# Patient Record
Sex: Male | Born: 1965
Health system: Southern US, Community
[De-identification: ages and names within clinical notes are randomized; demographics above are authoritative.]

## PROBLEM LIST (undated history)

## (undated) DIAGNOSIS — G4730 Sleep apnea, unspecified: Secondary | ICD-10-CM

## (undated) DIAGNOSIS — C4490 Unspecified malignant neoplasm of skin, unspecified: Secondary | ICD-10-CM

## (undated) DIAGNOSIS — J40 Bronchitis, not specified as acute or chronic: Secondary | ICD-10-CM

## (undated) DIAGNOSIS — K921 Melena: Secondary | ICD-10-CM

## (undated) DIAGNOSIS — T8859XA Other complications of anesthesia, initial encounter: Secondary | ICD-10-CM

## (undated) DIAGNOSIS — Z87442 Personal history of urinary calculi: Secondary | ICD-10-CM

## (undated) DIAGNOSIS — D509 Iron deficiency anemia, unspecified: Secondary | ICD-10-CM

## (undated) DIAGNOSIS — K635 Polyp of colon: Secondary | ICD-10-CM

## (undated) DIAGNOSIS — T4145XA Adverse effect of unspecified anesthetic, initial encounter: Secondary | ICD-10-CM

## (undated) DIAGNOSIS — R531 Weakness: Secondary | ICD-10-CM

## (undated) DIAGNOSIS — G4733 Obstructive sleep apnea (adult) (pediatric): Secondary | ICD-10-CM

## (undated) DIAGNOSIS — E785 Hyperlipidemia, unspecified: Secondary | ICD-10-CM

## (undated) DIAGNOSIS — K649 Unspecified hemorrhoids: Secondary | ICD-10-CM

## (undated) DIAGNOSIS — M1990 Unspecified osteoarthritis, unspecified site: Secondary | ICD-10-CM

## (undated) HISTORY — DX: Hyperlipidemia, unspecified: E78.5

## (undated) HISTORY — PX: COLONOSCOPY: SHX174

## (undated) HISTORY — DX: Sleep apnea, unspecified: G47.30

## (undated) HISTORY — PX: ESOPHAGOGASTRODUODENOSCOPY ENDOSCOPY: SHX5814

## (undated) HISTORY — PX: EYE SURGERY: SHX253

## (undated) HISTORY — PX: NOSE SURGERY: SHX723

## (undated) HISTORY — PX: PARTIAL KNEE ARTHROPLASTY: SHX2174

## (undated) HISTORY — DX: Unspecified hemorrhoids: K64.9

## (undated) HISTORY — DX: Iron deficiency anemia, unspecified: D50.9

## (undated) HISTORY — DX: Melena: K92.1

## (undated) HISTORY — DX: Weakness: R53.1

---

## 2004-01-09 HISTORY — PX: KNEE SURGERY: SHX244

## 2006-01-08 HISTORY — PX: BACK SURGERY: SHX140

## 2010-08-01 ENCOUNTER — Other Ambulatory Visit: Payer: Self-pay | Admitting: Sports Medicine

## 2010-08-01 ENCOUNTER — Ambulatory Visit
Admission: RE | Admit: 2010-08-01 | Discharge: 2010-08-01 | Disposition: A | Discharge status: Home / Self Care | Payer: Managed Care, Other (non HMO) | Source: Ambulatory Visit | Attending: Sports Medicine | Admitting: Sports Medicine

## 2010-08-01 DIAGNOSIS — T1490XA Injury, unspecified, initial encounter: Secondary | ICD-10-CM

## 2010-08-01 DIAGNOSIS — T148XXA Other injury of unspecified body region, initial encounter: Secondary | ICD-10-CM

## 2010-08-07 ENCOUNTER — Other Ambulatory Visit: Payer: Self-pay | Admitting: Internal Medicine

## 2010-08-08 ENCOUNTER — Encounter: Payer: Self-pay | Admitting: Internal Medicine

## 2010-08-22 ENCOUNTER — Encounter: Payer: Self-pay | Admitting: Internal Medicine

## 2010-08-22 ENCOUNTER — Other Ambulatory Visit: Payer: Managed Care, Other (non HMO) | Admitting: Internal Medicine

## 2010-08-22 DIAGNOSIS — Z0000 Encounter for general adult medical examination without abnormal findings: Secondary | ICD-10-CM

## 2010-08-22 LAB — CBC WITH DIFFERENTIAL/PLATELET
Basophils Relative: 1 % (ref 0–1)
Eosinophils Absolute: 0.1 10*3/uL (ref 0.0–0.7)
Eosinophils Relative: 2 % (ref 0–5)
HCT: 39.4 % (ref 39.0–52.0)
Hemoglobin: 11.8 g/dL — ABNORMAL LOW (ref 13.0–17.0)
MCH: 22.6 pg — ABNORMAL LOW (ref 26.0–34.0)
MCHC: 29.9 g/dL — ABNORMAL LOW (ref 30.0–36.0)
MCV: 75.3 fL — ABNORMAL LOW (ref 78.0–100.0)
Monocytes Absolute: 0.5 10*3/uL (ref 0.1–1.0)
Monocytes Relative: 8 % (ref 3–12)
Neutro Abs: 3.1 10*3/uL (ref 1.7–7.7)
RDW: 16.9 % — ABNORMAL HIGH (ref 11.5–15.5)

## 2010-08-22 LAB — COMPREHENSIVE METABOLIC PANEL
Albumin: 4 g/dL (ref 3.5–5.2)
Alkaline Phosphatase: 46 U/L (ref 39–117)
BUN: 16 mg/dL (ref 6–23)
Creat: 1.1 mg/dL (ref 0.50–1.35)
Glucose, Bld: 84 mg/dL (ref 70–99)
Potassium: 4.5 mEq/L (ref 3.5–5.3)

## 2010-08-22 LAB — LIPID PANEL
HDL: 69 mg/dL (ref >39)
LDL Cholesterol: 154 mg/dL — ABNORMAL HIGH (ref 0–99)
Total CHOL/HDL Ratio: 3.4 Ratio
Triglycerides: 75 mg/dL (ref <150)

## 2010-08-24 ENCOUNTER — Ambulatory Visit (INDEPENDENT_AMBULATORY_CARE_PROVIDER_SITE_OTHER): Payer: Managed Care, Other (non HMO) | Admitting: Internal Medicine

## 2010-08-24 ENCOUNTER — Encounter: Payer: Self-pay | Admitting: Internal Medicine

## 2010-08-24 ENCOUNTER — Other Ambulatory Visit: Payer: Self-pay | Admitting: Internal Medicine

## 2010-08-24 VITALS — BP 122/72 | HR 90 | Temp 97.9°F | Ht 68.0 in | Wt 182.0 lb

## 2010-08-24 DIAGNOSIS — D509 Iron deficiency anemia, unspecified: Secondary | ICD-10-CM

## 2010-08-24 DIAGNOSIS — E785 Hyperlipidemia, unspecified: Secondary | ICD-10-CM

## 2010-08-24 DIAGNOSIS — K625 Hemorrhage of anus and rectum: Secondary | ICD-10-CM

## 2010-08-24 DIAGNOSIS — Z0000 Encounter for general adult medical examination without abnormal findings: Secondary | ICD-10-CM

## 2010-08-24 LAB — POCT URINALYSIS DIPSTICK
Bilirubin, UA: NEGATIVE
Blood, UA: NEGATIVE
Ketones, UA: NEGATIVE
Nitrite, UA: NEGATIVE
Protein, UA: NEGATIVE
Urobilinogen, UA: NEGATIVE
pH, UA: 6.5

## 2010-08-24 LAB — IRON AND TIBC
%SAT: 6 % — ABNORMAL LOW (ref 20–55)
TIBC: 458 ug/dL — ABNORMAL HIGH (ref 215–435)

## 2010-09-05 ENCOUNTER — Encounter: Payer: Self-pay | Admitting: Internal Medicine

## 2010-09-05 DIAGNOSIS — K625 Hemorrhage of anus and rectum: Secondary | ICD-10-CM | POA: Insufficient documentation

## 2010-09-05 DIAGNOSIS — E785 Hyperlipidemia, unspecified: Secondary | ICD-10-CM | POA: Insufficient documentation

## 2010-09-05 NOTE — Progress Notes (Signed)
  Subjective:    Patient ID: Drew Ochoa, male    DOB: 03-18-1965, 45 y.o.   MRN: [id number]  HPI     First  visit for this 45 year old white male in for health maintenance. Patient had fasting lab studies done recently showing he is anemic. Patient complains of rectal bleeding off and on for 5 years. Thought he might have hemorrhoids. Has never had this evaluated. Was not aware he was anemic. Patient has no history of serious illnesses. No abdominal pain. No diarrhea. No blood in stool.    Review of Systems  Constitutional: Negative.   HENT: Negative.   Eyes: Negative.   Respiratory: Negative.   Cardiovascular: Negative.   Gastrointestinal: Positive for anal bleeding. Negative for nausea, abdominal pain, diarrhea, constipation, abdominal distention and rectal pain.  Genitourinary: Negative.   Musculoskeletal: Negative.   Neurological: Negative.   Hematological: Negative.   Psychiatric/Behavioral: Negative.        Objective:   Physical Exam  Vitals reviewed. Constitutional: He is oriented to person, place, and time. He appears well-developed and well-nourished.  HENT:  Head: Normocephalic and atraumatic.  Right Ear: External ear normal.  Left Ear: External ear normal.  Mouth/Throat: Oropharynx is clear and moist.  Eyes: Conjunctivae are normal. Pupils are equal, round, and reactive to light.  Neck: Neck supple. No JVD present. No thyromegaly present.  Cardiovascular: Normal rate, regular rhythm, normal heart sounds and intact distal pulses.   No murmur heard. Pulmonary/Chest: Effort normal and breath sounds normal. No respiratory distress. He has no wheezes. He has no rales.  Abdominal: Soft. Bowel sounds are normal. He exhibits no distension and no mass. There is no tenderness. There is no rebound and no guarding.  Genitourinary: Guaiac positive stool.       Anoscopy shows? Fungating area at 11:00  Musculoskeletal: Normal range of motion. He exhibits no edema and no  tenderness.  Lymphadenopathy:    He has no cervical adenopathy.  Neurological: He is alert and oriented to person, place, and time. He has normal reflexes. He displays normal reflexes. No cranial nerve deficit.  Skin: Skin is warm and dry. No rash noted.  Psychiatric: He has a normal mood and affect. His behavior is normal. Judgment and thought content normal.          Assessment & Plan:  Iron deficiency anemia  Rectal bleeding with? Mass in the rectum versus colitis  Hyperlipidemia-recommend trial of diet and exercise with recheck lipid panel in 6 months  Plan: GI evaluation as soon as possible

## 2010-09-06 ENCOUNTER — Telehealth: Payer: Self-pay

## 2010-09-06 NOTE — Telephone Encounter (Signed)
Patient has an appointment scheduled for 09/20/2010 at 1:30 with Dr. Rhea Belton at [facility], and he is aware of this

## 2010-09-20 ENCOUNTER — Encounter: Payer: Self-pay | Admitting: Internal Medicine

## 2010-09-20 ENCOUNTER — Ambulatory Visit (INDEPENDENT_AMBULATORY_CARE_PROVIDER_SITE_OTHER): Payer: Managed Care, Other (non HMO) | Admitting: Internal Medicine

## 2010-09-20 DIAGNOSIS — K625 Hemorrhage of anus and rectum: Secondary | ICD-10-CM

## 2010-09-20 DIAGNOSIS — D509 Iron deficiency anemia, unspecified: Secondary | ICD-10-CM

## 2010-09-20 MED ORDER — PEG-KCL-NACL-NASULF-NA ASC-C 100 G PO SOLR: 1.0000 | ORAL | Status: DC

## 2010-09-20 NOTE — Progress Notes (Signed)
Subjective:    Patient ID: Drew Ochoa, male    DOB: [birth year], 45 y.o.   MRN: [id number]  HPI Drew Ochoa is a 45 year old male with a past medical history of hyperlipidemia and a recent diagnosis of iron deficiency anemia he was seen in consultation at the request of Dr. Lenord Fellers for evaluation of rectal bleeding and iron deficiency. The patient reports that intermittently over the past one to 2 years he has been experiencing bright red blood per rectum. He says that this is present on the stool and on toilet tissue. It has been painless. He's had no other GI complaints. He reports that his stools are regular, formed, and otherwise brown.  He is noted no change in his stool caliber. He denies diarrhea or constipation. He said no abdominal pain. No nausea or vomiting. No heartburn. No dysphagia or odynophagia. No melena. His appetite remains good and his weight stable. He also reports no other obvious bleeding and no easy bruising.  Review of Systems Constitutional: Negative for fever, chills, night sweats, activity change, appetite change and unexpected weight change HEENT: Negative for sore throat, mouth sores and trouble swallowing. Eyes: Negative for visual disturbance Respiratory: Negative for cough, chest tightness and shortness of breath Cardiovascular: Negative for chest pain, palpitations and lower extremity swelling Gastrointestinal: See history of present illness Genitourinary: Negative for dysuria and hematuria. Musculoskeletal: Negative for back pain, arthralgias and myalgias Skin: Negative for rash or color change Neurological: Negative for headaches, weakness, numbness Hematological: Negative for adenopathy, negative for easy bruising/bleeding Psychiatric/behavioral: Negative for depressed mood, negative for anxiety  Past Medical History  Diagnosis Date  . Iron deficiency anemia   . Hyperlipidemia    Current Outpatient Prescriptions  Medication Sig Dispense Refill  .  FERROUS SULFATE PO Take 1 tablet by mouth 2 (two) times daily.        Marland Kitchen ibuprofen (ADVIL,MOTRIN) 200 MG tablet Take 200 mg by mouth every 6 (six) hours as needed.        . Naproxen Sodium (ALEVE) 220 MG CAPS Take 2 capsules by mouth as needed.        . peg 3350 powder (MOVIPREP) 100 G SOLR Take 1 kit (100 g total) by mouth as directed. See written handout  1 kit  0   No Known Allergies  Family History  Problem Relation Age of Onset  . Mental illness Mother   . Arthritis Father   . Diabetes Maternal Uncle   . Alzheimer's disease Maternal Uncle   . Depression Father   . Colon polyps Father   . Skin cancer Mother   --pt believes his father colon polyps were detected in his 79's   Social History  . Marital Status: Married    Number of Children: 0   Occupational History  . customs/import compliance - For [employer]    Social History Main Topics  . Smoking status: Current Some Day Smoker    Types: Cigars  . Smokeless tobacco: Never Used  . Alcohol Use: Yes     1 per day  . Drug Use: No      Objective:   Physical Exam BP 110/80  Pulse 76  Ht 5\' 8"  (1.727 m)  Wt 188 lb (85.276 kg)  BMI 28.59 kg/m2 Constitutional: Well-developed and well-nourished. No distress. HEENT: Normocephalic and atraumatic. Oropharynx is clear and moist. No oropharyngeal exudate. Conjunctivae are normal. Pupils are equal round and reactive to light. No scleral icterus. Neck: Neck supple. Trachea midline. Cardiovascular: Normal rate, regular  rhythm and intact distal pulses. No M/R/G Pulmonary/chest: Effort normal and breath sounds normal. No wheezing, rales or rhonchi. Abdominal: Soft, nontender, nondistended. Bowel sounds active throughout. There are no masses palpable. No hepatosplenomegaly. Lymphadenopathy: No cervical adenopathy noted. Neurological: Alert and oriented to person place and time. Skin: Skin is warm and dry. No rashes noted. Psychiatric: Normal mood and affect. Behavior is normal.  CBC     Component Value Date/Time   WBC 5.7 08/22/2010 0913   RBC 5.23 08/22/2010 0913   HGB 11.8* 08/22/2010 0913   HCT 39.4 08/22/2010 0913   PLT 313 08/22/2010 0913   MCV 75.3* 08/22/2010 0913   MCH 22.6* 08/22/2010 0913   MCHC 29.9* 08/22/2010 0913   RDW 16.9* 08/22/2010 0913   LYMPHSABS 1.9 08/22/2010 0913   MONOABS 0.5 08/22/2010 0913   EOSABS 0.1 08/22/2010 0913   BASOSABS 0.1 08/22/2010 0913   CMP     Component Value Date/Time   NA 140 08/22/2010 0913   K 4.5 08/22/2010 0913   CL 105 08/22/2010 0913   CO2 25 08/22/2010 0913   GLUCOSE 84 08/22/2010 0913   BUN 16 08/22/2010 0913   CREATININE 1.10 08/22/2010 0913   CALCIUM 9.1 08/22/2010 0913   PROT 6.8 08/22/2010 0913   ALBUMIN 4.0 08/22/2010 0913   AST 20 08/22/2010 0913   ALT 14 08/22/2010 0913   ALKPHOS 46 08/22/2010 0913   BILITOT 0.5 08/22/2010 0913      Assessment & Plan:  Is a 45 year old male with a past mental history of hyperlipidemia and a recent diagnosis of deficiency anemia. He also has painless rectal bleeding  1. Iron deficiency anemia/rectal bleeding -- given the patient's painless rectal bleeding and iron deficiency a colonoscopy is definitely indicated. We'll arrange this for him today. We discussed that if this is negative (i.e. No findings to explain iron deficiency), the next step would be upper endoscopy. He would prefer to start with a colonoscopy and perform the EGD at a later date if necessary. This is a reasonable start. I have advised he continue on his iron supplementation therapy.

## 2010-09-20 NOTE — Patient Instructions (Signed)
You have been scheduled for a Colonoscopy. Instructions have been provided. Your prep has been sent to your pharmacy.

## 2010-10-12 ENCOUNTER — Ambulatory Visit: Payer: Managed Care, Other (non HMO) | Admitting: Internal Medicine

## 2010-10-13 ENCOUNTER — Other Ambulatory Visit: Payer: Managed Care, Other (non HMO) | Admitting: Internal Medicine

## 2010-10-20 ENCOUNTER — Encounter: Payer: Self-pay | Admitting: Internal Medicine

## 2010-10-20 ENCOUNTER — Ambulatory Visit (INDEPENDENT_AMBULATORY_CARE_PROVIDER_SITE_OTHER): Payer: Managed Care, Other (non HMO) | Admitting: Internal Medicine

## 2010-10-20 VITALS — BP 119/76 | HR 78 | Temp 97.6°F

## 2010-10-20 DIAGNOSIS — K625 Hemorrhage of anus and rectum: Secondary | ICD-10-CM

## 2010-10-20 DIAGNOSIS — E611 Iron deficiency: Secondary | ICD-10-CM

## 2010-10-20 DIAGNOSIS — D649 Anemia, unspecified: Secondary | ICD-10-CM

## 2010-10-20 LAB — CBC WITH DIFFERENTIAL/PLATELET
Basophils Absolute: 0 10*3/uL (ref 0.0–0.1)
Basophils Relative: 0 % (ref 0–1)
HCT: 44.6 % (ref 39.0–52.0)
MCHC: 33.4 g/dL (ref 30.0–36.0)
Monocytes Absolute: 0.7 10*3/uL (ref 0.1–1.0)
Neutro Abs: 3.8 10*3/uL (ref 1.7–7.7)
Platelets: 292 10*3/uL (ref 150–400)
RDW: 20.9 % — ABNORMAL HIGH (ref 11.5–15.5)

## 2010-10-24 ENCOUNTER — Encounter: Payer: Self-pay | Admitting: *Deleted

## 2010-10-24 ENCOUNTER — Other Ambulatory Visit: Payer: Managed Care, Other (non HMO)

## 2010-10-24 ENCOUNTER — Encounter: Payer: Self-pay | Admitting: Internal Medicine

## 2010-10-24 ENCOUNTER — Ambulatory Visit (AMBULATORY_SURGERY_CENTER): Payer: Managed Care, Other (non HMO) | Admitting: Internal Medicine

## 2010-10-24 ENCOUNTER — Telehealth: Payer: Self-pay | Admitting: *Deleted

## 2010-10-24 VITALS — BP 105/68 | HR 71 | Temp 97.3°F | Resp 20 | Ht 68.0 in | Wt 188.0 lb

## 2010-10-24 DIAGNOSIS — D509 Iron deficiency anemia, unspecified: Secondary | ICD-10-CM

## 2010-10-24 DIAGNOSIS — K621 Rectal polyp: Secondary | ICD-10-CM

## 2010-10-24 DIAGNOSIS — K620 Anal polyp: Secondary | ICD-10-CM

## 2010-10-24 DIAGNOSIS — D126 Benign neoplasm of colon, unspecified: Secondary | ICD-10-CM

## 2010-10-24 DIAGNOSIS — K625 Hemorrhage of anus and rectum: Secondary | ICD-10-CM

## 2010-10-24 HISTORY — PX: COLONOSCOPY: SHX174

## 2010-10-24 MED ORDER — SODIUM CHLORIDE 0.9 % IV SOLN: 500.0000 mL | INTRAVENOUS | Status: DC

## 2010-10-24 NOTE — Patient Instructions (Addendum)
Follow your instructions on the [facility] and [employer] [facility]  Upper Endoscopy scheduled for October 30, 2010 at 9:00. Arrive at 8:00, Patient must have a caregiver with him and remain in waiting room until procedure completed.  Continue your medications.  Await pathology results.

## 2010-10-24 NOTE — Progress Notes (Signed)
Upper Endoscopy scheduled with wife at patient's convenience. Scheduled for October 30, 2010 at 0900 with arrival at 0800. Previsit done with Wife by Durwin Glaze RN. Patient taken to basement lab for Celiac Panel on discharge.

## 2010-10-24 NOTE — Telephone Encounter (Signed)
Order celiac panel per Dr Rhea Belton for ironn deficiency anemia.

## 2010-10-25 ENCOUNTER — Telehealth: Payer: Self-pay | Admitting: *Deleted

## 2010-10-25 LAB — CELIAC PANEL 10
Endomysial Screen: NEGATIVE
Tissue Transglutaminase Ab, IgA: 8.3 U/mL (ref <20)

## 2010-10-25 NOTE — Telephone Encounter (Signed)
No answer. Left message on voicemail

## 2010-10-27 ENCOUNTER — Telehealth: Payer: Self-pay | Admitting: *Deleted

## 2010-10-27 NOTE — Telephone Encounter (Signed)
lmom for pt to call back. Pt announced his name on the VM so I informed him of the negative Celiac Panel and Dr Rhea Belton will proceed with the EGD on 10/30/10 at 0900am; pt may call back with further questions.

## 2010-10-27 NOTE — Telephone Encounter (Signed)
Message copied by Florene Glen on Fri Oct 27, 2010  8:11 AM ------      Message from: Beverley Fiedler      Created: Wed Oct 25, 2010  2:59 PM       Celiac panel negative.  Proceed with EGD

## 2010-10-30 ENCOUNTER — Encounter: Payer: Self-pay | Admitting: Internal Medicine

## 2010-10-30 ENCOUNTER — Ambulatory Visit (AMBULATORY_SURGERY_CENTER): Payer: Managed Care, Other (non HMO) | Admitting: Internal Medicine

## 2010-10-30 DIAGNOSIS — D649 Anemia, unspecified: Secondary | ICD-10-CM

## 2010-10-30 DIAGNOSIS — K2940 Chronic atrophic gastritis without bleeding: Secondary | ICD-10-CM

## 2010-10-30 DIAGNOSIS — D509 Iron deficiency anemia, unspecified: Secondary | ICD-10-CM

## 2010-10-30 DIAGNOSIS — K625 Hemorrhage of anus and rectum: Secondary | ICD-10-CM

## 2010-10-30 MED ORDER — SODIUM CHLORIDE 0.9 % IV SOLN: 500.0000 mL | INTRAVENOUS | Status: DC

## 2010-10-30 NOTE — Patient Instructions (Signed)
MILD GASTRITIS  BIOPSIES TAKEN TO RULE OUT H. PYLORI AND CELIAC DISEASE     SEE GREEN AND BLUE SHEETS FOR ADDITIONAL D/C INSTRUCTIONS.

## 2010-10-31 ENCOUNTER — Encounter: Payer: Self-pay | Admitting: Internal Medicine

## 2010-10-31 ENCOUNTER — Telehealth: Payer: Self-pay

## 2010-10-31 NOTE — Telephone Encounter (Signed)
Left message on answering machine. 

## 2010-11-03 ENCOUNTER — Telehealth: Payer: Self-pay | Admitting: *Deleted

## 2010-11-03 ENCOUNTER — Encounter: Payer: Self-pay | Admitting: Internal Medicine

## 2010-11-03 DIAGNOSIS — E611 Iron deficiency: Secondary | ICD-10-CM

## 2010-11-03 NOTE — Telephone Encounter (Signed)
Message copied by Florene Glen on Fri Nov 03, 2010  4:38 PM ------      Message from: Beverley Fiedler      Created: Fri Nov 03, 2010  4:13 PM       Yes capsule endo for unexplain iron def.      thanks      ----- Message -----         From: Linna Hoff, RN         Sent: 11/03/2010   3:22 PM           To: Erick Blinks, MD            I erased the note for this pt before I printed the letter! Is the Capsule what the note is about? Thanks. cb

## 2010-11-03 NOTE — Telephone Encounter (Signed)
Notified pt Dr Rhea Belton would like for him to have a Capsule Endo r/t low iron levels. Pt was aware of the possibility. He will come Monday, 11/06/10 at 2pm for the teaching. I am putting his letter from Dr Rhea Belton in the mail today.

## 2010-11-04 NOTE — Patient Instructions (Signed)
Please proceed ahead with pan endoscopy. Return here in 9 months for physical examination. CBC with has been drawn today along with iron studies. Iron supplementation may or may not need to be continued depending on results of pan endoscopy

## 2010-11-04 NOTE — Progress Notes (Signed)
  Subjective:    Patient ID: Drew Ochoa, male    DOB: Jan 16, 1965, 45 y.o.   MRN: [id number]  HPI 45 year old white male who initially presented here in August as a new patient. Has had rectal bleeding off and on for 5 years and in that visit in August was found to have iron deficiency anemia. He has been referred to gastroenterologist and is due for colonoscopy soon. Has been taking iron sulfate 325 mg twice daily. Here today to followup on anemia. Feels well with no complaints.    Review of Systems     Objective:   Physical Exam skin is pale; cardiac exam regular rate and rhythm normal S1 and S2; chest clear        Assessment & Plan:  History of rectal bleeding-in the process of being evaluated and is due for panendoscopy later this week  Iron deficiency anemia  Plan CBC with differential and iron studies drawn. Notify patient of results. Await results of panendoscopy.

## 2010-11-06 NOTE — Telephone Encounter (Signed)
Pt here for instruction and teaching for Capsule Endoscopy per Dr Rhea Belton. Pt was shown leads that will connect to the computer unit for recording and the capsule he has to swallow. Pt given handouts about the module unit and purpose of the procedure as well as prep and diet instructions the day prior to the test. I will notify pt when his test is scheduled; Dr Rhea Belton has to sign the orders. Pt stated understanding.

## 2010-11-09 ENCOUNTER — Telehealth: Payer: Self-pay | Admitting: *Deleted

## 2010-11-09 DIAGNOSIS — D500 Iron deficiency anemia secondary to blood loss (chronic): Secondary | ICD-10-CM

## 2010-11-09 NOTE — Telephone Encounter (Signed)
Spoke with pt who is ok with his capsule appt.

## 2010-11-09 NOTE — Telephone Encounter (Signed)
lmom for pt to call back; scheduled Capsule Endo on 11/14/10 at 0800.

## 2010-11-14 ENCOUNTER — Ambulatory Visit (INDEPENDENT_AMBULATORY_CARE_PROVIDER_SITE_OTHER): Payer: Managed Care, Other (non HMO) | Admitting: Internal Medicine

## 2010-11-14 DIAGNOSIS — E611 Iron deficiency: Secondary | ICD-10-CM

## 2010-11-14 NOTE — Progress Notes (Signed)
Patient here for Capsule endoscopy for Dr. Rhea Belton. Patient verbalized understanding of verbal and written instructions. Patient has been NPO and did the prep required for the procedure. Patient swallowed the capsule without difficulty. Lot [id number]                                                                                                                                                     25

## 2010-12-06 ENCOUNTER — Telehealth: Payer: Self-pay | Admitting: *Deleted

## 2010-12-06 NOTE — Telephone Encounter (Signed)
lmom for pt to call  °

## 2010-12-06 NOTE — Telephone Encounter (Signed)
Notified pt of Dr Lauro Franklin  findings and recommendations. Pt stated understanding. Reminder to me in January to order labs.

## 2010-12-06 NOTE — Telephone Encounter (Signed)
Message copied by Florene Glen on Wed Dec 06, 2010  2:26 PM ------      Message from: Beverley Fiedler      Created: Wed Dec 06, 2010  1:18 PM      Regarding: VCE results       capsule endoscopy showed erythema in the duodenum, but this is likely of no consequence.  It is not felt to explain his iron deficiency.            Amy will print the report today            I recommend continuing on oral iron supplementation therapy and following blood counts to ensure normalization of iron stores      This can be done either through our office or his PCP Dr. Lenord Fellers

## 2011-02-05 ENCOUNTER — Telehealth: Payer: Self-pay | Admitting: *Deleted

## 2011-02-05 DIAGNOSIS — D509 Iron deficiency anemia, unspecified: Secondary | ICD-10-CM

## 2011-02-05 NOTE — Telephone Encounter (Signed)
Message copied by Florene Glen on Mon Feb 05, 2011  8:37 AM ------      Message from: Graciella Freer K      Created: Wed Dec 06, 2010  3:37 PM       Call to check iron levels. Ask Dr Rhea Belton what labs

## 2011-02-05 NOTE — Telephone Encounter (Signed)
Iron, tibc, ferritin, %sat, cbc with diff

## 2011-02-05 NOTE — Telephone Encounter (Signed)
lmom for pt to call back. Pt needs to come in for labs to see how well OTC iron is working.

## 2011-02-05 NOTE — Telephone Encounter (Signed)
Can you tell  what labs to order for pt? Pt had abnormal IRON, TIBC, UIBC,%SAT and was placed on oral iron. Thanks.

## 2011-02-06 ENCOUNTER — Telehealth: Payer: Self-pay | Admitting: Internal Medicine

## 2011-02-06 NOTE — Telephone Encounter (Signed)
Spoke with pt to inform him he needs to have labs drawn to see how well the po iron is working. Pt will come in at his convenience- he is out of town next week.

## 2011-02-06 NOTE — Telephone Encounter (Signed)
Called patient and left voicemail that he needed to call to schedule his 3 mo office visit with Dr. Lenord Fellers.  Patient is also due for CBC, IRON, and TIBC.

## 2011-02-23 ENCOUNTER — Encounter: Payer: Self-pay | Admitting: Internal Medicine

## 2011-02-23 ENCOUNTER — Ambulatory Visit (INDEPENDENT_AMBULATORY_CARE_PROVIDER_SITE_OTHER): Payer: Managed Care, Other (non HMO) | Admitting: Internal Medicine

## 2011-02-23 VITALS — BP 102/76 | HR 76 | Temp 97.6°F | Wt 190.0 lb

## 2011-02-23 DIAGNOSIS — Z23 Encounter for immunization: Secondary | ICD-10-CM

## 2011-02-23 DIAGNOSIS — K648 Other hemorrhoids: Secondary | ICD-10-CM

## 2011-02-23 DIAGNOSIS — D509 Iron deficiency anemia, unspecified: Secondary | ICD-10-CM

## 2011-02-23 LAB — IRON AND TIBC
%SAT: 18 % — ABNORMAL LOW (ref 20–55)
TIBC: 317 ug/dL (ref 215–435)

## 2011-02-24 LAB — CBC WITH DIFFERENTIAL/PLATELET
Basophils Absolute: 0.1 10*3/uL (ref 0.0–0.1)
Eosinophils Relative: 2 % (ref 0–5)
HCT: 44.7 % (ref 39.0–52.0)
Hemoglobin: 14.6 g/dL (ref 13.0–17.0)
Lymphocytes Relative: 29 % (ref 12–46)
Lymphs Abs: 2 10*3/uL (ref 0.7–4.0)
MCV: 90.5 fL (ref 78.0–100.0)
Monocytes Absolute: 0.7 10*3/uL (ref 0.1–1.0)
Monocytes Relative: 10 % (ref 3–12)
RDW: 14.6 % (ref 11.5–15.5)
WBC: 6.8 10*3/uL (ref 4.0–10.5)

## 2011-02-25 ENCOUNTER — Encounter: Payer: Self-pay | Admitting: Internal Medicine

## 2011-02-25 DIAGNOSIS — K648 Other hemorrhoids: Secondary | ICD-10-CM | POA: Insufficient documentation

## 2011-02-25 NOTE — Progress Notes (Signed)
  Subjective:    Patient ID: Drew Ochoa, male    DOB: 1965-08-23, 46 y.o.   MRN: [id number]  HPI Patient initially presented here as a new patient August 2012. He was found to be iron deficient with serum iron 28 with an iron-binding capacity of 458. Percent saturation was low at 6%. Hemoglobin was 11.8 g with an MCV of 75.3. Patient reported having had rectal bleeding for 5 years. He was sent to gastroenterologist, Dr. Rhea Belton who performed both endoscopy and colonoscopy. Patient was found to have large internal hemorrhoids and mild gastritis. He was placed on oral iron replacement and continues to take that. He says that in November he was on a trip and had significant rectal bleeding once again. In January he had another episode of rectal bleeding- both of these episodes  described as being a lot a bleeding. CBC with differential and serum iron studies drawn today. He has appointment to see gastroenterologist next week. Otherwise feels well with no complaints. Had influenza immunization at work. Was given Tdap vaccine in the office today.    Review of Systems     Objective:   Physical Exam skin is pale warm and dry; chest clear; cardiac exam regular rate and rhythm        Assessment & Plan:  History of iron deficiency anemia presumably for rectal bleeding from internal hemorrhoids  History of mild gastritis diagnosed on endoscopy  History of hyperlipidemia  Plan: Patient is to see gastroenterologist next week. CBC, iron, iron-binding capacity drawn today. He continues to have issues with rectal bleeding. May be a candidate for hemorrhoidectomy. Asked him to discuss with gastroenterologist. Return for annual physical exam August 2013. We'll review lab studies and advise further.

## 2011-02-25 NOTE — Patient Instructions (Signed)
We have drawn a complete blood count, serum iron and iron-binding capacity today. Results will be available to Dr. Rhea Belton. Please keep appointment to see him next week. Please discuss with Dr. Rhea Belton about possible hemorrhoidectomy for persistent rectal bleeding.

## 2011-02-28 ENCOUNTER — Telehealth: Payer: Self-pay | Admitting: *Deleted

## 2011-02-28 NOTE — Telephone Encounter (Signed)
lmom for pt to call. We are questioning whether he needs an appt for rectal bleeding f/u.

## 2011-02-28 NOTE — Telephone Encounter (Signed)
I reviewed this from Dr. Lenord Fellers, I'm not sure if he was supposed to see me or not because I do not see an appointment for him, but office followup is reasonable. Aram Beecham could you facilitate this please

## 2011-02-28 NOTE — Telephone Encounter (Signed)
Spoke with pt about Dr Beryle Quant result note and recent bleeding episodes. Pt reports he hasn't had any bleeding since the 1st of the year. He thinks the hemorrhoids came from traveling too much. Pt scheduled for f/u on 03/06/11.

## 2011-03-05 ENCOUNTER — Encounter: Payer: Self-pay | Admitting: Internal Medicine

## 2011-03-06 ENCOUNTER — Encounter: Payer: Self-pay | Admitting: Internal Medicine

## 2011-03-06 ENCOUNTER — Ambulatory Visit (INDEPENDENT_AMBULATORY_CARE_PROVIDER_SITE_OTHER): Payer: Managed Care, Other (non HMO) | Admitting: Internal Medicine

## 2011-03-06 VITALS — BP 116/86 | HR 76 | Ht 68.0 in | Wt 190.0 lb

## 2011-03-06 DIAGNOSIS — D649 Anemia, unspecified: Secondary | ICD-10-CM

## 2011-03-06 DIAGNOSIS — K648 Other hemorrhoids: Secondary | ICD-10-CM

## 2011-03-06 DIAGNOSIS — E611 Iron deficiency: Secondary | ICD-10-CM

## 2011-03-06 NOTE — Patient Instructions (Signed)
Please take your iron pills  Once a day until May 23, 2011 have labs drawn. DO NOT GO BACK on your iron pills until notified by Dr. Rhea Belton.  Have labs re-drawn in mid August.   Follow up with Dr. Rhea Belton as needed.

## 2011-03-06 NOTE — Progress Notes (Signed)
Subjective:    Patient ID: Ilai Hiller, male    DOB: [birth year], 46 y.o.   MRN: [id number]  HPI Mr. Tugwell is a 46 year old male who I've previously seen in evaluation for iron deficiency anemia who seen for followup. He is alone today. He underwent upper endoscopy, colonoscopy, and subsequent video capsule endoscopy without revealing a cause for his iron deficiency. He did have mild gastritis in the stomach which was H. pylori negative. He was found to have moderate internal hemorrhoids and 2 small hyperplastic rectal polyps.  His video capsule endoscopy was unremarkable. He was thus treated with oral iron supplementation, and labs recently revealed improvement in his iron deficiency and normalization of his red cell count and MCV.  He reports no troublesome symptoms at present. He is feeling well. No rectal bleeding or trouble with internal hemorrhoids of late. He is taking iron orally on most days, when he remembers. He was initially taking it twice daily, but he was told to decrease this to once daily. He denies diarrhea and constipation. No abdominal pain. No nausea or vomiting. He's exercising more now but has gained some weight. No heartburn trouble. No dysphagia.  Review of Systems As per history of present illness otherwise negative  Past Medical History  Diagnosis Date  . Iron deficiency anemia   . Hyperlipidemia    Current Outpatient Prescriptions  Medication Sig Dispense Refill  . FERROUS SULFATE PO Take 1 tablet by mouth daily.       Marland Kitchen ibuprofen (ADVIL,MOTRIN) 200 MG tablet Take 200 mg by mouth every 6 (six) hours as needed.        . Naproxen Sodium (ALEVE) 220 MG CAPS Take 2 capsules by mouth as needed.         No Known Allergies  Family History  Problem Relation Age of Onset  . Mental illness Mother   . Arthritis Father   . Diabetes Maternal Uncle   . Alzheimer's disease Maternal Uncle   . Depression Father   . Colon polyps Father   . Skin cancer Mother     Social History  . Marital Status: Married   Occupational History  . customs/import compliance    Social History Main Topics  . Smoking status: Current Some Day Smoker    Types: Cigars  . Smokeless tobacco: Never Used  . Alcohol Use: 1.8 oz/week    3 Glasses of wine per week     1 per day  . Drug Use: No      Objective:   Physical Exam BP 116/86  Pulse 76  Ht 5\' 8"  (1.727 m)  Wt 190 lb (86.183 kg)  BMI 28.89 kg/m2 Constitutional: Well-developed and well-nourished. No distress. HEENT: Normocephalic and atraumatic. Oropharynx is clear and moist. No oropharyngeal exudate. Conjunctivae are normal. Pupils are equal round and reactive to light. No scleral icterus. Neck: Neck supple. Trachea midline. Cardiovascular: Normal rate, regular rhythm and intact distal pulses. No M/R/G Pulmonary/chest: Effort normal and breath sounds normal. No wheezing, rales or rhonchi. Abdominal: Soft, nontender, nondistended. Bowel sounds active throughout. There are no masses palpable. No hepatosplenomegaly. Extremities: no clubbing, cyanosis, or edema Lymphadenopathy: No cervical adenopathy noted. Neurological: Alert and oriented to person place and time. Skin: Skin is warm and dry. No rashes noted. Psychiatric: Normal mood and affect. Behavior is normal.  CBC    Component Value Date/Time   WBC 6.8 02/23/2011 1613   RBC 4.94 02/23/2011 1613   HGB 14.6 02/23/2011 1613   HCT 44.7 02/23/2011  1613   PLT 264 02/23/2011 1613   MCV 90.5 02/23/2011 1613   MCH 29.6 02/23/2011 1613   MCHC 32.7 02/23/2011 1613   RDW 14.6 02/23/2011 1613   LYMPHSABS 2.0 02/23/2011 1613   MONOABS 0.7 02/23/2011 1613   EOSABS 0.1 02/23/2011 1613   BASOSABS 0.1 02/23/2011 1613   Iron/TIBC/Ferritin    Component Value Date/Time   IRON 57 02/23/2011 1613   TIBC 317 02/23/2011 1613       Assessment & Plan:  46 year old male who I've previously seen in evaluation for iron deficiency anemia who seen for followup.  1. Hx of iron  def anemia -- I do not have an etiology to explain his iron def anemia, but it has responded to oral iron replacement.  He is not having any overt bleeding at present, and his internal hemorrhoids are not bothering him now.  Certainly he could have had occult blood loss, but there is no evidence to support this is continuing.  He was celiac negative.  His iron sat is still just a bit low, so for now, I have recommended he continue with once daily po iron for 3 months, then repeat CBC + iron stores.  If normal at that point, I have advised he stop PO iron supplements and then monitor H/H from there.  We will plan CBC + iron stores in Aug 2013 (3 months off iron).  Should anemia or iron def return, we will consider further evaluation at that time.  He remains symptom free.  He is agreeable with this plan.  2. Internal hemorrhoids -- not currently a problem, but if they become troublesome, bleeding, pain, etc, then we can Rx topical therapy.  [facility] referral to be considered if not responding to medical therapy.  He will call if they flare or bother him.  Return PRN but I will follow lab results.

## 2011-06-11 ENCOUNTER — Ambulatory Visit (INDEPENDENT_AMBULATORY_CARE_PROVIDER_SITE_OTHER): Payer: Managed Care, Other (non HMO) | Admitting: Internal Medicine

## 2011-06-11 VITALS — BP 110/84 | HR 76 | Temp 98.4°F | Ht 68.0 in | Wt 188.0 lb

## 2011-06-11 DIAGNOSIS — R0789 Other chest pain: Secondary | ICD-10-CM

## 2011-06-11 DIAGNOSIS — J069 Acute upper respiratory infection, unspecified: Secondary | ICD-10-CM

## 2011-06-11 DIAGNOSIS — R071 Chest pain on breathing: Secondary | ICD-10-CM

## 2011-06-24 ENCOUNTER — Encounter: Payer: Self-pay | Admitting: Internal Medicine

## 2011-06-24 NOTE — Patient Instructions (Addendum)
Take Augmentin 500 mg 3 times daily for 10 days. Take Tessalon Perles as needed for cough. Call if not better in one week.

## 2011-06-24 NOTE — Progress Notes (Signed)
  Subjective:    Patient ID: Drew Ochoa, male    DOB: February 01, 1965, 46 y.o.   MRN: [id number]  HPI 46 year old white male with history of iron deficiency anemia presumably secondary to hemorrhoids and rectal bleeding has been worked up by gastroenterologist. Aside from hemorrhoids negative colonoscopy. He is in today with URI symptoms and chest pain. Has been having respiratory infection symptoms for several days. Doesn't seem to be getting better. Has had a lot of coughing. Some discolored sputum. No fever or shaking chills. Malaise and fatigue.    Review of Systems     Objective:   Physical Exam HEENT exam: TMs are clear, pharynx is clear, neck is supple without significant adenopathy, chest is clear to auscultation without rales or wheezing. He has palpable chest wall pain and left lateral chest.        Assessment & Plan:  URI  Chest wall pain  Plan: Augmentin 500 mg 3 times daily for 10 days. Tessalon Perles 100 mg (#60) 2 by mouth 3 times a day when necessary cough. May take anti-inflammatory medication for chest wall pain.

## 2011-08-23 ENCOUNTER — Other Ambulatory Visit: Payer: Managed Care, Other (non HMO) | Admitting: Internal Medicine

## 2011-08-23 DIAGNOSIS — Z0000 Encounter for general adult medical examination without abnormal findings: Secondary | ICD-10-CM

## 2011-08-23 LAB — CBC WITH DIFFERENTIAL/PLATELET
Basophils Absolute: 0.1 10*3/uL (ref 0.0–0.1)
Eosinophils Absolute: 0.2 10*3/uL (ref 0.0–0.7)
Eosinophils Relative: 3 % (ref 0–5)
Lymphs Abs: 1.7 10*3/uL (ref 0.7–4.0)
MCH: 30.4 pg (ref 26.0–34.0)
MCV: 83.6 fL (ref 78.0–100.0)
Monocytes Absolute: 0.7 10*3/uL (ref 0.1–1.0)
Platelets: 268 10*3/uL (ref 150–400)
RDW: 13.7 % (ref 11.5–15.5)

## 2011-08-23 LAB — COMPREHENSIVE METABOLIC PANEL
ALT: 13 U/L (ref 0–53)
BUN: 12 mg/dL (ref 6–23)
CO2: 28 mEq/L (ref 19–32)
Creat: 1.18 mg/dL (ref 0.50–1.35)
Total Bilirubin: 0.9 mg/dL (ref 0.3–1.2)

## 2011-08-23 LAB — LIPID PANEL
Cholesterol: 213 mg/dL — ABNORMAL HIGH (ref 0–200)
HDL: 61 mg/dL (ref >39)
Total CHOL/HDL Ratio: 3.5 Ratio
VLDL: 13 mg/dL (ref 0–40)

## 2011-08-24 ENCOUNTER — Encounter: Payer: Self-pay | Admitting: Internal Medicine

## 2011-08-24 ENCOUNTER — Ambulatory Visit (INDEPENDENT_AMBULATORY_CARE_PROVIDER_SITE_OTHER): Payer: Managed Care, Other (non HMO) | Admitting: Internal Medicine

## 2011-08-24 VITALS — BP 114/84 | HR 80 | Temp 97.0°F | Ht 68.0 in | Wt 186.0 lb

## 2011-08-24 DIAGNOSIS — K648 Other hemorrhoids: Secondary | ICD-10-CM

## 2011-08-24 DIAGNOSIS — M25561 Pain in right knee: Secondary | ICD-10-CM

## 2011-08-24 DIAGNOSIS — M25569 Pain in unspecified knee: Secondary | ICD-10-CM

## 2011-08-24 DIAGNOSIS — Z862 Personal history of diseases of the blood and blood-forming organs and certain disorders involving the immune mechanism: Secondary | ICD-10-CM

## 2011-08-24 DIAGNOSIS — Z0000 Encounter for general adult medical examination without abnormal findings: Secondary | ICD-10-CM

## 2011-08-24 DIAGNOSIS — M2240 Chondromalacia patellae, unspecified knee: Secondary | ICD-10-CM

## 2011-08-24 LAB — POCT URINALYSIS DIPSTICK
Blood, UA: NEGATIVE
Ketones, UA: NEGATIVE
Protein, UA: NEGATIVE
Spec Grav, UA: 1.01
Urobilinogen, UA: NEGATIVE
pH, UA: 6

## 2011-08-26 DIAGNOSIS — M25561 Pain in right knee: Secondary | ICD-10-CM | POA: Insufficient documentation

## 2011-08-26 DIAGNOSIS — M25562 Pain in left knee: Secondary | ICD-10-CM | POA: Insufficient documentation

## 2011-08-26 NOTE — Patient Instructions (Addendum)
Please see Dr. Roanna Epley for bilateral knee pain. Return in one year or as needed. Watch diet and try to exercise. Watch amounts of anti-inflammatory medications that she take. These can aggravate rectal bleeding. They also can aggravate and cause gastritis.

## 2011-08-26 NOTE — Progress Notes (Signed)
Subjective:    Patient ID: Drew Ochoa, male    DOB: September 01, 1965, 46 y.o.   MRN: [id number]  HPI 46 year old white male with history of iron deficiency anemia thought secondary to significant rectal bleeding from hemorrhoids status post thorough GI workup and 2012. Patient was taking iron supplement but quit a few months ago. Now has normal hemoglobin. Rectal bleeding has decreased in frequency and seldom occurs now. First presented to the office late August 2012. History of hyperlipidemia started on diet exercise in 2012.   He had colonoscopy in October showing large internal hemorrhoids. He subsequently had upper endoscopy showing antral gastritis. He had 2 small sessile colon polyps and pathology showed them  to be hyperplastic polyps. Subsequently had a capsule endoscopy that proved to be negative.  Grandmother with history of massive stroke is 14 and died at age 49. Maternal grandfather had CABG at age 47 and died at age 23. Mother with history of depression and skin cancer. Father with history of rheumatoid arthritis diagnosed at age 43. History of colon polyps. Paternal grandfather with history of alcoholism but recovered at age 77. Had CABG at age 44 and died at age 43. Paternal grandmother living in good health.       When initially evaluated in August 2012 he had an iron level of 28 with TIBC of 458. Hemoglobin was 11.8 with an MCV of 75.3. Total cholesterol was 238 with an LDL cholesterol of 154, HDL cholesterol of 69 and triglycerides of 75.  Social history: He is married and works for [employer]. Wife travels extensively in [place] and has been gone for several months and recently returned. No children.  Occasionally smokes cigars. Social alcohol consumption.  Brings up a new problem today of bilateral knee pain. Review of Systems  Constitutional: Negative.   HENT: Negative.   Eyes: Negative.   Respiratory: Negative.   Cardiovascular: Negative.   Genitourinary: Negative.    Musculoskeletal:       Bilateral knee pain  Neurological: Negative.   Hematological: Negative.   Psychiatric/Behavioral: Negative.        Objective:   Physical Exam  Vitals reviewed. Constitutional: He is oriented to person, place, and time. He appears well-developed and well-nourished. No distress.  HENT:  Head: Normocephalic and atraumatic.  Right Ear: External ear normal.  Left Ear: External ear normal.  Mouth/Throat: Oropharynx is clear and moist.  Eyes: Conjunctivae and EOM are normal. Pupils are equal, round, and reactive to light. Right eye exhibits no discharge. Left eye exhibits no discharge. No scleral icterus.  Neck: Neck supple. No JVD present. No thyromegaly present.  Cardiovascular: Normal rate, regular rhythm and normal heart sounds.   No murmur heard. Pulmonary/Chest: Effort normal and breath sounds normal. He has no rales.  Abdominal: Soft. Bowel sounds are normal. He exhibits no distension and no mass. There is no tenderness. There is no rebound and no guarding.  Genitourinary: Prostate normal.       1 large external hemorrhoidal tag  Musculoskeletal: He exhibits tenderness. He exhibits no edema.       Tenderness over left patella. Crepitus left knee. Right knee some tenderness along medial collateral ligament. Bilateral swelling of ankles. History of multiple ankle sprains. Superficial varicosities around the ankle  Lymphadenopathy:    He has no cervical adenopathy.  Neurological: He is alert and oriented to person, place, and time. He has normal reflexes. No cranial nerve deficit. Coordination normal.  Skin: Skin is warm and dry. No rash noted. He  is not diaphoretic.  Psychiatric: He has a normal mood and affect. His behavior is normal. Judgment and thought content normal.          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  Moderate hyperlipidemia-improved  Plan: Return in one year or as needed. 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 Suggest patient see Dr. Roanna Epley regarding knee pain

## 2011-09-18 ENCOUNTER — Ambulatory Visit (INDEPENDENT_AMBULATORY_CARE_PROVIDER_SITE_OTHER): Payer: Managed Care, Other (non HMO) | Admitting: Sports Medicine

## 2011-09-18 ENCOUNTER — Encounter: Payer: Self-pay | Admitting: Sports Medicine

## 2011-09-18 VITALS — BP 116/80 | HR 88 | Ht 68.0 in | Wt 188.0 lb

## 2011-09-18 DIAGNOSIS — M25561 Pain in right knee: Secondary | ICD-10-CM

## 2011-09-18 DIAGNOSIS — M25569 Pain in unspecified knee: Secondary | ICD-10-CM

## 2011-09-18 NOTE — Progress Notes (Signed)
  [facility]  Patient name: Drew Ochoa MRN [id number]  Date of birth: Oct 01, 1965  CC & HPI:  Cleatus Gabriel is a 46 y.o. male presenting today for evaluation of B knee pain.  He reports that he has had ~1 year of nagging pain in his R > L knee located along the medial joint line.  He has crepitation, clicking.  Denies recent locking but does report a history of this Bilaterally.    Pain is worse in the morning after exerting himself the day before.  Some moderate pain following exercise.   Participates in Hiking, cycling, walking.  Pain @ with cycling.  Hiking/walking pain after to 1 hour.    Crepitations and clicking occur after sitting at work desk.    No falls.  ROS:  Per HPI  Pertinent History Reviewed:  Medical & Surgical Hx:  Reviewed: Significant for HLD, Iron deficiency anemia and prior gastritis Medications: Reviewed & Updated - see associated section Social History: Reviewed - Significant for non-smoker  Objective Findings:  Vitals:  Filed Vitals:   09/18/11 1522  BP: 116/80  Pulse: 88    PE: GENERAL:  Adult caucasian male. In no discomfort; no respiratory distress. KNEE:  R knee: medial joint line tenderness, negative McMurrary's, positive patellar grind,  A/P drawer negative; negative varus/valgus stress,  Left patellar subluxation with 90 degrees of flexion.  Poor tracking of R patella HIP: Abductors 4/5 strength Bilateral ANKLE:  Mildly + drawer on R, no pitting edema FOOT: pes cavus foot with mild flattening of R with weight bearing.  Good forefoot mobility, normal ROM, normal Strength in all planes   Assessment & Plan:

## 2011-09-18 NOTE — Assessment & Plan Note (Signed)
Mild B lateral patellar subluxation with deep knee flexion.   + crepitation VMO strengthening exercises provided + hip abduction exercises provided B patellar-helix straps provided B sports insoles with scaphoid pads provided; to be worn with all activity Instructed to be evaluated at local bicycle shop for appropriate positioning on bike  Plan to follow up in 4-6 weeks to reassess.  Consider trial of NSAIDs if not significantly improved.

## 2011-10-24 ENCOUNTER — Ambulatory Visit: Payer: Managed Care, Other (non HMO) | Admitting: Sports Medicine

## 2011-11-13 ENCOUNTER — Telehealth: Payer: Self-pay | Admitting: Internal Medicine

## 2011-11-13 NOTE — Telephone Encounter (Signed)
If he has [facility], send him to Ollen Gross.  If he has [employer], to [employer]. OK to send him back to Dr. Rhea Belton. Please call them for appt.

## 2011-11-13 NOTE — Telephone Encounter (Signed)
Sp w/Ellen Andrey Campanile,  PhD...she takes [place].  Sp w/patient, provided Dr. Tawana Scale # and address.  Dr. Rhea Belton ([facility]) sched for 11/12 @ 10:00.  Sp w/patient and made aware.  Pt verbalizes understanding.

## 2011-11-19 ENCOUNTER — Encounter: Payer: Self-pay | Admitting: Internal Medicine

## 2011-11-20 ENCOUNTER — Ambulatory Visit (INDEPENDENT_AMBULATORY_CARE_PROVIDER_SITE_OTHER): Payer: Managed Care, Other (non HMO) | Admitting: Internal Medicine

## 2011-11-20 ENCOUNTER — Encounter: Payer: Self-pay | Admitting: Sports Medicine

## 2011-11-20 ENCOUNTER — Ambulatory Visit (INDEPENDENT_AMBULATORY_CARE_PROVIDER_SITE_OTHER): Payer: Managed Care, Other (non HMO) | Admitting: Sports Medicine

## 2011-11-20 ENCOUNTER — Encounter: Payer: Self-pay | Admitting: Internal Medicine

## 2011-11-20 ENCOUNTER — Other Ambulatory Visit (INDEPENDENT_AMBULATORY_CARE_PROVIDER_SITE_OTHER): Payer: Managed Care, Other (non HMO)

## 2011-11-20 VITALS — BP 117/84 | HR 77 | Ht 68.0 in | Wt 190.0 lb

## 2011-11-20 VITALS — BP 106/84 | HR 64 | Ht 68.0 in | Wt 190.0 lb

## 2011-11-20 DIAGNOSIS — M25561 Pain in right knee: Secondary | ICD-10-CM

## 2011-11-20 DIAGNOSIS — K625 Hemorrhage of anus and rectum: Secondary | ICD-10-CM

## 2011-11-20 DIAGNOSIS — D649 Anemia, unspecified: Secondary | ICD-10-CM

## 2011-11-20 DIAGNOSIS — K648 Other hemorrhoids: Secondary | ICD-10-CM

## 2011-11-20 DIAGNOSIS — M25569 Pain in unspecified knee: Secondary | ICD-10-CM

## 2011-11-20 LAB — CBC WITH DIFFERENTIAL/PLATELET
Basophils Relative: 0.6 % (ref 0.0–3.0)
Eosinophils Relative: 1.7 % (ref 0.0–5.0)
HCT: 42.2 % (ref 39.0–52.0)
Hemoglobin: 14.2 g/dL (ref 13.0–17.0)
Lymphs Abs: 1.4 10*3/uL (ref 0.7–4.0)
MCV: 88 fl (ref 78.0–100.0)
Monocytes Absolute: 0.5 10*3/uL (ref 0.1–1.0)
Monocytes Relative: 10.6 % (ref 3.0–12.0)
Neutro Abs: 2.9 10*3/uL (ref 1.4–7.7)
Platelets: 290 10*3/uL (ref 150.0–400.0)
WBC: 5 10*3/uL (ref 4.5–10.5)

## 2011-11-20 LAB — IBC PANEL: Saturation Ratios: 13 % — ABNORMAL LOW (ref 20.0–50.0)

## 2011-11-20 MED ORDER — HYDROCORTISONE ACETATE 25 MG RE SUPP: 25.0000 mg | Freq: Two times a day (BID) | RECTAL | Status: DC

## 2011-11-20 NOTE — Progress Notes (Signed)
Patient ID: Drew Ochoa, male   DOB: Jun 03, 1965, 46 y.o.   MRN: [id number]  Follow up of bilat PFPS He had lateral tracking and hip weakness on last visit Insoles have helped a lot for hiking HEP has led to at least 50% decrease in pain No locking No giving way No swelling  PE NAD  Knee: Normal to inspection with no erythema or effusion or obvious bony abnormalities. Palpation normal with no warmth or joint line tenderness or patellar tenderness or condyle tenderness. ROM normal in flexion and extension and lower leg rotation. Ligaments with solid consistent endpoints including ACL, PCL, LCL, MCL. Negative Mcmurray's and provocative meniscal tests. Non painful patellar compression. Patellar and quadriceps tendons unremarkable. Hamstring and quadriceps strength is normal.  With repeat knee flexion he gets some lateral tracking superior patella left with click On Rt he has an area along lateral patella that clicks and has probably thickened plica  Hip strength is now normal bilat

## 2011-11-20 NOTE — Assessment & Plan Note (Signed)
This has improved a good deal  Still needs to work on building more VMO strength  HEP advanced today  If this improves a lot next 6 wks can RTC prn but keep up insoles and some of exercises

## 2011-11-20 NOTE — Progress Notes (Signed)
  Subjective:    Patient ID: Drew Ochoa, male    DOB: [birth year], 46 y.o.   MRN: [id number]  HPI Drew Ochoa is a 46 year old male known to me for evaluation of iron deficiency anemia. He underwent upper endoscopy, colonoscopy, and be a capsule endoscopy without revealing a cause for iron deficiency. He did have mild gastritis which was H. pylori negative. He was also found to have 2 rectal hyperplastic polyps and moderate internal hemorrhoids. He was treated with oral iron supplementation therapy and his iron deficiency resolved. He returns today to discuss bright red rectal bleeding. He reports in late October having significant rectal bleeding with each bowel movement. This lasted for approximately 8 days. It was associated with anal pain. He did not treat this with any over-the-counter preparation. He reports he was having brown stool, along with bright red rectal bleeding. At times it was significant enough to drip into the toilet water. He at times had leakage of bloody material. No frank leakage of stool. He denies diarrhea and constipation. Usually his stools are soft and even at times loose. No abdominal pain. Appetite is good. No nausea or vomiting. No fevers or chills. No change in weight.   Review of Systems As per history of present illness, otherwise negative  Current Medications, Allergies, Past Medical History, Past Surgical History, Family History and Social History were reviewed in [employer] record.     Objective:   Physical Exam BP 106/84  Pulse 64  Ht 5\' 8"  (1.727 m)  Wt 190 lb (86.183 kg)  BMI 28.89 kg/m2 Constitutional: Well-developed and well-nourished. No distress. HEENT: Normocephalic and atraumatic. Oropharynx is clear and moist. No oropharyngeal exudate. Conjunctivae are normal.  No scleral icterus. Cardiovascular: Normal rate, regular rhythm and intact distal pulses. No M/R/G Pulmonary/chest: Effort normal and breath sounds normal. No  wheezing, rales or rhonchi. Abdominal: Soft, nontender, nondistended. Bowel sounds active throughout. There are no masses palpable. No hepatosplenomegaly. Extremities: no clubbing, cyanosis, or edema Neurological: Alert and oriented to person place and time. Skin: Skin is warm and dry. No rashes noted. Psychiatric: Normal mood and affect. Behavior is normal.  Labs -- pending today (CBC, iron studies)      Assessment & Plan:  46 year old male known to me for evaluation of iron deficiency anemia. He underwent upper endoscopy, colonoscopy, and be a capsule endoscopy without revealing a cause for iron deficiency who returns for bright red rectal bleeding  1.  Internal hemorrhoids -- the patient's symptoms are very consistent with hemorrhoidal bleeding. He had moderate sized internal hemorrhoids as seen at the time of his colonoscopy in October 2012.  Given his bleeding I will check a CBC today along with iron stores. I also will give him prescription for hydrocortisone suppositories 25 mg twice a day. He can use these for 5-7 days at a time for any hemorrhoidal flare. I also will refer him to [facility] for further evaluation and definitive hemorrhoid treatment.  He is in full agreement with this referral.  2.  IDA -- I will recheck iron stores today.

## 2011-11-20 NOTE — Patient Instructions (Addendum)
Your physician has requested that you go to the basement for the following lab work before leaving today:   We have sent the following medications to your pharmacy for you to pick up at your convenience: Hydrocortisone suppositories. Please take as directed  You will be contacted by [facility] to schedule a consultation for Hemorrhoid treatment

## 2011-11-22 ENCOUNTER — Telehealth: Payer: Self-pay | Admitting: *Deleted

## 2011-11-22 MED ORDER — INTEGRA 62.5-62.5-40-3 MG PO CAPS: 1.0000 | ORAL_CAPSULE | Freq: Every day | ORAL | Status: DC

## 2011-11-22 NOTE — Telephone Encounter (Signed)
Informed pt of lab results per Dr Rhea Belton and the need for Integra x 2 months and I will call him in 3 months for repeat labs. He was not aware of his appt at [facility], but I gave him the info with address and phone number. Pt stated understanding. Reminder in.

## 2011-11-22 NOTE — Telephone Encounter (Signed)
Message copied by Florene Glen on Thu Nov 22, 2011  3:37 PM ------      Message from: Beverley Fiedler      Created: Wed Nov 21, 2011  9:20 PM       hgb is normal which is good news.      Ferritin and iron are very slightly low.  I would recommend Integra 1 tablet daily for 2 months.  Recheck ferritin, iron, TIBC and CBC in 3 months      He should have [facility] referral pending for hemorrhoids

## 2011-11-30 ENCOUNTER — Ambulatory Visit (INDEPENDENT_AMBULATORY_CARE_PROVIDER_SITE_OTHER): Payer: Commercial Indemnity | Admitting: General Surgery

## 2011-11-30 ENCOUNTER — Encounter (INDEPENDENT_AMBULATORY_CARE_PROVIDER_SITE_OTHER): Payer: Self-pay | Admitting: General Surgery

## 2011-11-30 VITALS — BP 120/72 | HR 70 | Temp 97.6°F | Resp 18 | Ht 68.0 in | Wt 188.1 lb

## 2011-11-30 DIAGNOSIS — K625 Hemorrhage of anus and rectum: Secondary | ICD-10-CM

## 2011-11-30 NOTE — Patient Instructions (Addendum)

## 2011-11-30 NOTE — Progress Notes (Signed)
Chief Complaint  Patient presents with  . Hemorrhoids    HISTORY: Drew Ochoa is a 46 y.o. male who presents to the office with rectal bleeding.  Other symptoms include rectal pain.  He has tried hemorrhoid suppositories in the past with some success.  Diarrhea makes the symptoms worse.  This had been occurring for several years.  It is intermittent in nature.  His bowel habits are somewhat regular and his bowel movements are soft to loose.  His fiber intake is minimal.  His last colonoscopy was last year.  He does not have any prolapsing tissue.      Past Medical History  Diagnosis Date  . Iron deficiency anemia   . Hyperlipidemia   . Hemorrhoids   . Weakness   . Blood in stool       Past Surgical History  Procedure Date  . Knee surgery 2006    right  . Nose surgery     cyst removal   . Eye surgery     cyst removal   . Back surgery     cyst removal         Current Outpatient Prescriptions  Medication Sig Dispense Refill  . Fe Fum-FePoly-Vit C-Vit B3 (INTEGRA) 62.5-62.5-40-3 MG CAPS Take 1 capsule by mouth daily.  60 capsule  1  . FERROUS SULFATE PO Take 1 tablet by mouth daily.           No Known Allergies    Family History  Problem Relation Age of Onset  . Mental illness Mother   . Arthritis Father   . Diabetes Maternal Uncle   . Alzheimer's disease Maternal Uncle   . Depression Father   . Colon polyps Father   . Skin cancer Mother     History   Social History  . Marital Status: Married    Spouse Name: N/A    Number of Children: 0  . Years of Education: N/A   Occupational History  . customs/import compliance    Social History Main Topics  . Smoking status: Never Smoker   . Smokeless tobacco: Never Used  . Alcohol Use: 1.8 oz/week    3 Glasses of wine per week     Comment: 1 per day  . Drug Use: No  . Sexually Active: None   Other Topics Concern  . None   Social History Narrative  . None      REVIEW OF SYSTEMS - PERTINENT POSITIVES  ONLY: Review of Systems - General ROS: negative for - chills, fever or weight loss Hematological and Lymphatic ROS: negative for - bleeding problems or blood clots Respiratory ROS: no cough, shortness of breath, or wheezing Cardiovascular ROS: no chest pain or dyspnea on exertion Gastrointestinal ROS: positive for - blood in stools negative for - abdominal pain or constipation  EXAM: Filed Vitals:   11/30/11 1556  BP: 120/72  Pulse: 70  Temp: 97.6 F (36.4 C)  Resp: 18    General appearance: alert and cooperative Resp: clear to auscultation bilaterally Cardio: regular rate and rhythm GI: soft, non-tender; bowel sounds normal; no masses,  no organomegaly   Procedure: Anoscopy Surgeon: Maisie Fus Diagnosis: rectal bleeding  Assistant: Christella Scheuermann After the risks and benefits were explained, verbal consent was obtained for above procedure  Anesthesia: none Findings: large internal hemorrhoids, all 3 quadrants  Lab Results  Component Value Date   WBC 5.0 11/20/2011   HGB 14.2 11/20/2011   HCT 42.2 11/20/2011   MCV 88.0 11/20/2011  PLT 290.0 11/20/2011    ASSESSMENT AND PLAN: Drew Ochoa is a 46 y.o. M who was referred to me for internal bleeding hemorrhoids and anemia.  He has mostly internal hemorrhoids.  This may have been the cause of his anemia.  I have recommended that he undergo a THD hemorrhoidopexy.  I have explained to him the risks of the procedure, which include bleeding, urinary retention and recurrence.  We discussed other options as well, but felt that this was the best treatment for his problems.  We will schedule this at his convenience.     Vanita Panda, MD Colon and Rectal Surgery / General Surgery [facility]      Visit Diagnoses: 1. Rectal bleeding     Primary Care Physician: Margaree Mackintosh, MD

## 2011-12-17 ENCOUNTER — Telehealth (INDEPENDENT_AMBULATORY_CARE_PROVIDER_SITE_OTHER): Payer: Self-pay

## 2011-12-17 NOTE — Telephone Encounter (Signed)
The pt called with questions about his recovery.  He wants to know how long he will need to recover.  I told him one week.  He has plans to ride to [place] 12/21 with his wife which is a 17 hr drive.  I told him it could be risky that close to surgery if he has complications.  I told him he would need to get up and walk around often to prevent blood clots.  I confirmed with Dr Maisie Fus.  She agreed with one week.  She said he may be uncomfortable sitting that long and he would have to walk frequently.  She thought he may be over the worst of his pain by then.   I informed the pt. I told him to avoid constipation.  I said he would want to be on a stool softener and drink plenty of liquids.  He may reschedule surgery for after the holiday after speaking to his wife.  I gave him the phone # to [facility] in scheduling.

## 2011-12-18 ENCOUNTER — Telehealth (INDEPENDENT_AMBULATORY_CARE_PROVIDER_SITE_OTHER): Payer: Self-pay

## 2011-12-18 ENCOUNTER — Encounter (INDEPENDENT_AMBULATORY_CARE_PROVIDER_SITE_OTHER): Payer: Self-pay

## 2011-12-18 ENCOUNTER — Encounter (HOSPITAL_COMMUNITY): Payer: Self-pay | Admitting: Pharmacy Technician

## 2011-12-18 NOTE — Telephone Encounter (Signed)
Message copied by Ivory Broad on Tue Dec 18, 2011  3:41 PM ------      Message from: Leanne Chang      Created: Tue Dec 18, 2011  3:25 PM      Regarding: Dr Glenna Durand: [phone]       Patient needs something stating how long he will be in recovery for work

## 2011-12-18 NOTE — Telephone Encounter (Signed)
I called and left the pt a message to call me.  I need to know which day he is going back to work.  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  I also need to know if he wants the note faxed to him or will he pick it up.

## 2011-12-18 NOTE — Telephone Encounter (Signed)
I reached the pt and he just needs a note mailed to his home for his job stating how long recovery will be.  I will get that out to him today.

## 2012-01-15 ENCOUNTER — Encounter: Payer: Self-pay | Admitting: Internal Medicine

## 2012-01-15 ENCOUNTER — Ambulatory Visit (INDEPENDENT_AMBULATORY_CARE_PROVIDER_SITE_OTHER): Payer: Managed Care, Other (non HMO) | Admitting: Internal Medicine

## 2012-01-15 VITALS — BP 106/78 | Temp 97.0°F | Wt 187.0 lb

## 2012-01-15 DIAGNOSIS — J40 Bronchitis, not specified as acute or chronic: Secondary | ICD-10-CM

## 2012-01-15 DIAGNOSIS — J329 Chronic sinusitis, unspecified: Secondary | ICD-10-CM

## 2012-01-15 DIAGNOSIS — J029 Acute pharyngitis, unspecified: Secondary | ICD-10-CM

## 2012-01-15 NOTE — Patient Instructions (Addendum)
Take Levaquin 500 mg nightly for 7 days. Take Hycodan every 6-8 hours as needed for cough. Call if not better in 10 days.

## 2012-01-15 NOTE — Addendum Note (Signed)
Addended by: Judy Pimple on: 01/15/2012 05:15 PM   Modules accepted: Orders

## 2012-01-15 NOTE — Progress Notes (Signed)
  Subjective:    Patient ID: Dillan Candela, male    DOB: Mar 07, 1965, 47 y.o.   MRN: [id number]  HPI Onset URI symptoms around New Year's Eve. Took influenza vaccine at work early December. Cough is productive of discolored sputum. Cough is congested and thought is sore . Has headache. Ears hurt. No fever. No shaking chills except for first night. Tired. No myalgias.    Review of Systems     Objective:   Physical Exam HEENT exam: Slightly injected pharynx. TMs clear. Neck is supple without significant adenopathy. Chest clear. He has a congested cough.        Assessment & Plan:  Sinusitis  Bronchitis  Plan: Levaquin 500 milligrams daily for 7 days. Hycodan 8 ounces 1 teaspoon by mouth every 6 hours when necessary cough.

## 2012-01-24 ENCOUNTER — Encounter (HOSPITAL_COMMUNITY)
Admission: RE | Admit: 2012-01-24 | Discharge: 2012-01-24 | Disposition: A | Discharge status: Home / Self Care | Payer: Managed Care, Other (non HMO) | Source: Ambulatory Visit | Attending: General Surgery | Admitting: General Surgery

## 2012-01-24 ENCOUNTER — Encounter (HOSPITAL_COMMUNITY): Payer: Self-pay

## 2012-01-24 HISTORY — DX: Bronchitis, not specified as acute or chronic: J40

## 2012-01-24 HISTORY — DX: Adverse effect of unspecified anesthetic, initial encounter: T41.45XA

## 2012-01-24 HISTORY — DX: Polyp of colon: K63.5

## 2012-01-24 HISTORY — DX: Other complications of anesthesia, initial encounter: T88.59XA

## 2012-01-24 LAB — CBC
HCT: 38.2 % — ABNORMAL LOW (ref 39.0–52.0)
Hemoglobin: 12.7 g/dL — ABNORMAL LOW (ref 13.0–17.0)
MCHC: 33.2 g/dL (ref 30.0–36.0)
RBC: 4.58 MIL/uL (ref 4.22–5.81)
WBC: 6.9 10*3/uL (ref 4.0–10.5)

## 2012-01-24 LAB — SURGICAL PCR SCREEN
MRSA, PCR: NEGATIVE
Staphylococcus aureus: NEGATIVE

## 2012-01-24 NOTE — Patient Instructions (Signed)
20 Drew Ochoa  01/24/2012   Your procedure is scheduled on: 02/01/12  Report to [facility] at 0715 AM.  Call this number if you have problems the morning of surgery [phone]: [phone]   Remember:   Do not eat food or drink liquids After Midnight.      Do not wear jewelry, make-up or nail polish.  Do not wear lotions, powders, or perfumes. You may wear deodorant.  Do not shave 48 hours prior to surgery. Men may shave face and neck.  Do not bring valuables to the hospital.  Contacts, dentures or bridgework may not be worn into surgery.    Patients discharged the day of surgery will not be allowed to drive home.  Name and phone number of your driver: Mel Almond [phone]     Please read over the following fact sheets that you were given: MRSA Information.  Birdie Sons, RN  pre op nurse call if needed [phone]    FAILURE TO FOLLOW THESE INSTRUCTIONS MAY RESULT IN CANCELLATION OF YOUR SURGERY   Patient Signature: ___________________________________________

## 2012-02-01 ENCOUNTER — Ambulatory Visit (HOSPITAL_COMMUNITY)
Admission: RE | Admit: 2012-02-01 | Discharge: 2012-02-01 | Disposition: A | Discharge status: Home / Self Care | Payer: Managed Care, Other (non HMO) | Source: Ambulatory Visit | Attending: General Surgery | Admitting: General Surgery

## 2012-02-01 ENCOUNTER — Ambulatory Visit (HOSPITAL_COMMUNITY): Payer: Managed Care, Other (non HMO) | Admitting: Anesthesiology

## 2012-02-01 ENCOUNTER — Encounter (HOSPITAL_COMMUNITY): Payer: Self-pay | Admitting: *Deleted

## 2012-02-01 ENCOUNTER — Encounter (HOSPITAL_COMMUNITY)
Admission: RE | Disposition: A | Discharge status: Home / Self Care | Payer: Self-pay | Source: Ambulatory Visit | Attending: General Surgery

## 2012-02-01 ENCOUNTER — Encounter (HOSPITAL_COMMUNITY): Payer: Self-pay | Admitting: Anesthesiology

## 2012-02-01 DIAGNOSIS — E785 Hyperlipidemia, unspecified: Secondary | ICD-10-CM | POA: Insufficient documentation

## 2012-02-01 DIAGNOSIS — K648 Other hemorrhoids: Secondary | ICD-10-CM | POA: Insufficient documentation

## 2012-02-01 DIAGNOSIS — D509 Iron deficiency anemia, unspecified: Secondary | ICD-10-CM | POA: Insufficient documentation

## 2012-02-01 DIAGNOSIS — Z79899 Other long term (current) drug therapy: Secondary | ICD-10-CM | POA: Insufficient documentation

## 2012-02-01 HISTORY — PX: TRANSANAL HEMORRHOIDAL DEARTERIALIZATION: SHX6136

## 2012-02-01 SURGERY — TRANSANAL HEMORRHOIDAL DEARTERIALIZATION
Anesthesia: General | Site: Anus | Wound class: Dirty or Infected

## 2012-02-01 MED ORDER — PROMETHAZINE HCL 25 MG/ML IJ SOLN
6.2500 mg | INTRAMUSCULAR | Status: DC | PRN
  Administered 2012-02-01: 6.25 mg via INTRAVENOUS
  Filled 2012-02-01: qty 1

## 2012-02-01 MED ORDER — LACTATED RINGERS IV SOLN: INTRAVENOUS | Status: DC

## 2012-02-01 MED ORDER — LACTATED RINGERS IV SOLN
INTRAVENOUS | Status: DC | PRN
  Administered 2012-02-01: 09:00:00 via INTRAVENOUS

## 2012-02-01 MED ORDER — OXYCODONE HCL 5 MG PO TABS: 5.0000 mg | ORAL_TABLET | ORAL | Status: DC | PRN

## 2012-02-01 MED ORDER — ACETAMINOPHEN 10 MG/ML IV SOLN
INTRAVENOUS | Status: DC | PRN
  Administered 2012-02-01: 1000 mg via INTRAVENOUS

## 2012-02-01 MED ORDER — ONDANSETRON HCL 4 MG/2ML IJ SOLN
4.0000 mg | Freq: Four times a day (QID) | INTRAMUSCULAR | Status: DC | PRN
  Administered 2012-02-01: 4 mg via INTRAVENOUS
  Filled 2012-02-01: qty 2

## 2012-02-01 MED ORDER — DIAZEPAM 5 MG/ML IJ SOLN: 2.5000 mg | Freq: Once | INTRAMUSCULAR | Status: DC

## 2012-02-01 MED ORDER — FENTANYL CITRATE 0.05 MG/ML IJ SOLN
INTRAMUSCULAR | Status: DC | PRN
  Administered 2012-02-01 (×5): 25 ug via INTRAVENOUS
  Administered 2012-02-01: 100 ug via INTRAVENOUS
  Administered 2012-02-01 (×2): 50 ug via INTRAVENOUS
  Administered 2012-02-01: 25 ug via INTRAVENOUS

## 2012-02-01 MED ORDER — DEXAMETHASONE SODIUM PHOSPHATE 10 MG/ML IJ SOLN
INTRAMUSCULAR | Status: DC | PRN
  Administered 2012-02-01: 10 mg via INTRAVENOUS

## 2012-02-01 MED ORDER — HYDROMORPHONE HCL PF 1 MG/ML IJ SOLN
0.2500 mg | INTRAMUSCULAR | Status: DC | PRN
  Administered 2012-02-01 (×4): 0.5 mg via INTRAVENOUS

## 2012-02-01 MED ORDER — DOCUSATE SODIUM 100 MG PO CAPS: 100.0000 mg | ORAL_CAPSULE | Freq: Two times a day (BID) | ORAL | Status: DC

## 2012-02-01 MED ORDER — MIDAZOLAM HCL 5 MG/5ML IJ SOLN
INTRAMUSCULAR | Status: DC | PRN
  Administered 2012-02-01: 2 mg via INTRAVENOUS

## 2012-02-01 MED ORDER — HYDROMORPHONE HCL PF 1 MG/ML IJ SOLN
INTRAMUSCULAR | Status: AC
  Filled 2012-02-01: qty 1

## 2012-02-01 MED ORDER — PSYLLIUM 58.6 % PO POWD: 1.0000 | Freq: Three times a day (TID) | ORAL | Status: DC

## 2012-02-01 MED ORDER — ONDANSETRON HCL 4 MG/2ML IJ SOLN
INTRAMUSCULAR | Status: DC | PRN
  Administered 2012-02-01: 4 mg via INTRAVENOUS

## 2012-02-01 MED ORDER — PROPOFOL 10 MG/ML IV BOLUS
INTRAVENOUS | Status: DC | PRN
  Administered 2012-02-01: 100 mg via INTRAVENOUS
  Administered 2012-02-01: 20 mg via INTRAVENOUS

## 2012-02-01 MED ORDER — LIDOCAINE HCL 1 % IJ SOLN
INTRAMUSCULAR | Status: AC
  Filled 2012-02-01: qty 20

## 2012-02-01 MED ORDER — 0.9 % SODIUM CHLORIDE (POUR BTL) OPTIME
TOPICAL | Status: DC | PRN
  Administered 2012-02-01: 1000 mL

## 2012-02-01 MED ORDER — BUPIVACAINE LIPOSOME 1.3 % IJ SUSP
20.0000 mL | Freq: Once | INTRAMUSCULAR | Status: AC
  Administered 2012-02-01: 20 mL
  Filled 2012-02-01: qty 20

## 2012-02-01 MED ORDER — DIAZEPAM 5 MG PO TABS
5.0000 mg | ORAL_TABLET | Freq: Once | ORAL | Status: AC
  Administered 2012-02-01: 5 mg via ORAL
  Filled 2012-02-01: qty 1

## 2012-02-01 MED ORDER — LIDOCAINE HCL 1 % IJ SOLN
INTRAMUSCULAR | Status: DC | PRN
  Administered 2012-02-01: 10 mL

## 2012-02-01 MED ORDER — DIAZEPAM 5 MG PO TABS: 5.0000 mg | ORAL_TABLET | Freq: Four times a day (QID) | ORAL | Status: DC | PRN

## 2012-02-01 MED ORDER — ACETAMINOPHEN 10 MG/ML IV SOLN
INTRAVENOUS | Status: AC
  Filled 2012-02-01: qty 100

## 2012-02-01 SURGICAL SUPPLY — 36 items
BLADE EXTENDED COATED 6.5IN (ELECTRODE) ×2 IMPLANT
BLADE HEX COATED 2.75 (ELECTRODE) ×2 IMPLANT
BRIEF STRETCH FOR OB PAD LRG (UNDERPADS AND DIAPERS) ×2 IMPLANT
CANISTER SUCTION 2500CC (MISCELLANEOUS) ×2 IMPLANT
CLOTH BEACON ORANGE TIMEOUT ST (SAFETY) ×2 IMPLANT
DECANTER SPIKE VIAL GLASS SM (MISCELLANEOUS) ×2 IMPLANT
DRSG PAD ABDOMINAL 8X10 ST (GAUZE/BANDAGES/DRESSINGS) ×2 IMPLANT
ELECT REM PT RETURN 9FT ADLT (ELECTROSURGICAL) ×2
ELECTRODE REM PT RTRN 9FT ADLT (ELECTROSURGICAL) ×1 IMPLANT
GAUZE SPONGE 4X4 16PLY XRAY LF (GAUZE/BANDAGES/DRESSINGS) ×2 IMPLANT
GLOVE BIOGEL PI IND STRL 7.0 (GLOVE) ×1 IMPLANT
GLOVE BIOGEL PI INDICATOR 7.0 (GLOVE) ×1
GLOVE ECLIPSE 8.0 STRL XLNG CF (GLOVE) ×2 IMPLANT
GLOVE INDICATOR 8.0 STRL GRN (GLOVE) ×4 IMPLANT
GLOVE SURG SS PI 6.5 STRL IVOR (GLOVE) ×2 IMPLANT
GOWN PREVENTION PLUS XXLARGE (GOWN DISPOSABLE) ×2 IMPLANT
GOWN STRL REIN XL XLG (GOWN DISPOSABLE) ×4 IMPLANT
HEMOSTAT SURGICEL 4X8 (HEMOSTASIS) ×2 IMPLANT
KIT SLIDE ONE PROLAPS HEMORR (KITS) ×2 IMPLANT
LUBRICANT JELLY K Y 4OZ (MISCELLANEOUS) ×2 IMPLANT
NDL SAFETY ECLIPSE 18X1.5 (NEEDLE) ×1 IMPLANT
NEEDLE HYPO 18GX1.5 SHARP (NEEDLE) ×1
NEEDLE HYPO 22GX1.5 SAFETY (NEEDLE) ×2 IMPLANT
NS IRRIG 1000ML POUR BTL (IV SOLUTION) ×2 IMPLANT
PACK LITHOTOMY IV (CUSTOM PROCEDURE TRAY) ×2 IMPLANT
PENCIL BUTTON HOLSTER BLD 10FT (ELECTRODE) ×2 IMPLANT
SPONGE GAUZE 4X4 12PLY (GAUZE/BANDAGES/DRESSINGS) ×4 IMPLANT
SPONGE HEMORRHOID 8X3CM (HEMOSTASIS) ×2 IMPLANT
SPONGE SURGIFOAM ABS GEL 100 (HEMOSTASIS) ×2 IMPLANT
SPONGE SURGIFOAM ABS GEL SZ50 (HEMOSTASIS) ×2 IMPLANT
SUT CHROMIC 2 0 SH (SUTURE) IMPLANT
SUT CHROMIC 3 0 SH 27 (SUTURE) IMPLANT
SUT VIC AB 2-0 UR6 27 (SUTURE) IMPLANT
SYR 20CC LL (SYRINGE) ×2 IMPLANT
TOWEL OR 17X26 10 PK STRL BLUE (TOWEL DISPOSABLE) ×2 IMPLANT
YANKAUER SUCT BULB TIP 10FT TU (MISCELLANEOUS) ×2 IMPLANT

## 2012-02-01 NOTE — Anesthesia Postprocedure Evaluation (Signed)
Anesthesia Post Note  Patient: Drew Ochoa  Procedure(s) Performed: Procedure(s) (LRB): TRANSANAL HEMORRHOIDAL DEARTERIALIZATION (N/A)  Anesthesia type: General  Patient location: PACU  Post pain: Pain level controlled  Post assessment: Post-op Vital signs reviewed  Last Vitals:  Filed Vitals:   02/01/12 1145  BP: 127/79  Pulse: 88  Temp:   Resp: 17    Post vital signs: Reviewed  Level of consciousness: sedated  Complications: No apparent anesthesia complications

## 2012-02-01 NOTE — Progress Notes (Signed)
Patient back from PACU since 1230. Has been very drowsy. Wakes easily and has been up to restroom with RN  Without being  able to void. C/o nausea at this time. Zofran given per order. Patient resting at present with wife at bedside. Continue to monitor.

## 2012-02-01 NOTE — Preoperative (Signed)
Beta Blockers   Reason not to administer Beta Blockers:Not Applicable 

## 2012-02-01 NOTE — Anesthesia Preprocedure Evaluation (Signed)
Anesthesia Evaluation  Patient identified by MRN, date of birth, ID band Patient awake    Reviewed: Allergy & Precautions, H&P , NPO status , Patient's Chart, lab work & pertinent test results  History of Anesthesia Complications (+) PROLONGED EMERGENCE  Airway Mallampati: II TM Distance: >3 FB Neck ROM: Full    Dental  (+) Teeth Intact and Dental Advisory Given   Pulmonary neg pulmonary ROS,  breath sounds clear to auscultation  Pulmonary exam normal       Cardiovascular negative cardio ROS  Rhythm:Regular Rate:Normal     Neuro/Psych negative neurological ROS  negative psych ROS   GI/Hepatic negative GI ROS, Neg liver ROS,   Endo/Other  negative endocrine ROS  Renal/GU negative Renal ROS  negative genitourinary   Musculoskeletal negative musculoskeletal ROS (+)   Abdominal   Peds  Hematology Anemia   Anesthesia Other Findings   Reproductive/Obstetrics negative OB ROS                           Anesthesia Physical Anesthesia Plan  ASA: I  Anesthesia Plan: General   Post-op Pain Management:    Induction: Intravenous  Airway Management Planned: LMA  Additional Equipment:   Intra-op Plan:   Post-operative Plan: Extubation in OR  Informed Consent: I have reviewed the patients History and Physical, chart, labs and discussed the procedure including the risks, benefits and alternatives for the proposed anesthesia with the patient or authorized representative who has indicated his/her understanding and acceptance.   Dental advisory given  Plan Discussed with: CRNA  Anesthesia Plan Comments:         Anesthesia Quick Evaluation

## 2012-02-01 NOTE — Op Note (Signed)
02/01/2012  10:57 AM  PATIENT:  Drew Ochoa  47 y.o. male  Patient Care Team: Margaree Mackintosh, MD as PCP - General (Internal Medicine)  PRE-OPERATIVE DIAGNOSIS:  bleeding internal hemorrhoids  POST-OPERATIVE DIAGNOSIS:  bleeding internal hemorrhoids  PROCEDURE:  Procedure(s): TRANSANAL HEMORRHOIDAL DEARTERIALIZATION  SURGEON:  Surgeon(s): Romie Levee, MD  ASSISTANT: none   ANESTHESIA:   topical and MAC  EBL:  Total I/O In: 600 [I.V.:600] Out: 100 [Blood:100]  COUNTS:  YES  PLAN OF CARE: Discharge to home after PACU  PATIENT DISPOSITION:  PACU - hemodynamically stable.  INDICATION: Bleeding internal hemorrhoids  OR FINDINGS: Grade 4  Internal hemorrhoids  DESCRIPTION: The patient was identified in the preoperative holding area and taken to the OR where they were laid supine on the operating room table. MAC anesthesia was smoothly induced.  The patient was placed  in lithotomy position and then prepped and draped in the usual sterile fashion. A surgical timeout was performed indicating the correct patient, procedure, positioning and preoperative antibiotics. SCDs were noted to be in place and functioning prior to the operation.  I did digital rectal examination and then transitioned over to anoscopy to get a sense of the anatomy.  I infused subcutaneous lidocaine for local anesthesia during surgery.  I switched over to the [facility] fiberoptically lit Doppler anocope.   Using the Doppler on the tip of the THD anoscope, I identified the arterial hemorrhoidal vessels coming in in the classic hexagonal anatomical pattern  (right posterior/lateral/anterior, left posterior /lateral/anterior).    I proceeded to ligate the hemorrhoidal arteries. I used a 2-0 Vicryl suture on a UR-6 needle in a figure-of-eight fashion over the signal around 6 cm proximal to the anal verge. I then ran that stitch longitudinally more distally to the white line of [place]. I then tied that stitch down  to cause a hemorrhoidopexy. I did that for all 6 locations.    I redid Doppler anoscopy. I Identified a signal at the right anterior and left lateral locations.  I isolated and ligated these separately with a figure-of-eight stitch. Signals went away.  At completion of this, all hemorrhoids were reduced into the rectum.  There is no more prolapse. External anatomy looked normal.  I repeated anoscopy and examination.   Hemostasis was good. I placed a soft Gelfoam cylinder into the rectum and performed a rectal block using Experel. Patient is being extubated go to recovery room.  I am about to discuss the patient's status to the family.

## 2012-02-01 NOTE — Progress Notes (Signed)
Zofran did not help with nausea so 6.25 of Phenergan given. Pt has been up to void several times without success. Bladder scan done with only 300cc in bladder. Has has 1500cc in IVF. Dr Maisie Fus called and orders received.

## 2012-02-01 NOTE — H&P (Signed)
Chief Complaint   Patient presents with   .  Hemorrhoids   HISTORY: Drew Ochoa is a 47 y.o. male who presents to the office with rectal bleeding. Other symptoms include rectal pain. He has tried hemorrhoid suppositories in the past with some success. Diarrhea makes the symptoms worse. This had been occurring for several years. It is intermittent in nature. His bowel habits are somewhat regular and his bowel movements are soft to loose. His fiber intake is minimal. His last colonoscopy was last year. He does not have any prolapsing tissue.   Past Medical History   Diagnosis  Date   .  Iron deficiency anemia    .  Hyperlipidemia    .  Hemorrhoids    .  Weakness    .  Blood in stool     Past Surgical History   Procedure  Date   .  Knee surgery  2006     right   .  Nose surgery      cyst removal   .  Eye surgery      cyst removal   .  Back surgery      cyst removal    Current Outpatient Prescriptions   Medication  Sig  Dispense  Refill   .  Fe Fum-FePoly-Vit C-Vit B3 (INTEGRA) 62.5-62.5-40-3 MG CAPS  Take 1 capsule by mouth daily.  60 capsule  1   .  FERROUS SULFATE PO  Take 1 tablet by mouth daily.     No Known Allergies  Family History   Problem  Relation  Age of Onset   .  Mental illness  Mother    .  Arthritis  Father    .  Diabetes  Maternal Uncle    .  Alzheimer's disease  Maternal Uncle    .  Depression  Father    .  Colon polyps  Father    .  Skin cancer  Mother     History    Social History   .  Marital Status:  Married     Spouse Name:  N/A     Number of Children:  0   .  Years of Education:  N/A    Occupational History   .  customs/import compliance     Social History Main Topics   .  Smoking status:  Never Smoker   .  Smokeless tobacco:  Never Used   .  Alcohol Use:  1.8 oz/week     3 Glasses of wine per week      Comment: 1 per day   .  Drug Use:  No   .  Sexually Active:  None    Other Topics  Concern   .  None    Social History  Narrative   .  None    REVIEW OF SYSTEMS - PERTINENT POSITIVES ONLY:  Review of Systems - General ROS: negative for - chills, fever or weight loss  Hematological and Lymphatic ROS: negative for - bleeding problems or blood clots  Respiratory ROS: no cough, shortness of breath, or wheezing  Cardiovascular ROS: no chest pain or dyspnea on exertion  Gastrointestinal ROS: positive for - blood in stools  negative for - abdominal pain or constipation  EXAM:   Filed Vitals:   02/01/12 0718  BP: 124/80  Pulse: 78  Temp: 97.6 F (36.4 C)  Resp: 18   General appearance: alert and cooperative  Resp: clear to auscultation bilaterally  Cardio: regular  rate and rhythm  GI: soft, non-tender; bowel sounds normal; no masses, no organomegaly   Anal Exam Findings: large internal hemorrhoids, all 3 quadrants  Lab Results   Component  Value  Date    WBC  5.0  11/20/2011    HGB  14.2  11/20/2011    HCT  42.2  11/20/2011    MCV  88.0  11/20/2011    PLT  290.0  11/20/2011    ASSESSMENT AND PLAN:  Drew Ochoa is a 47 y.o. M who was referred to me for internal bleeding hemorrhoids and anemia. He has mostly internal hemorrhoids. This may have been the cause of his anemia. I have recommended that he undergo a THD hemorrhoidopexy. I have explained to him the risks of the procedure, which include bleeding, urinary retention, pain and recurrence. We discussed other medical options as well, but felt that this was the best treatment for his problems. We will schedule this at his convenience.   Vanita Panda, MD  Colon and Rectal Surgery / General Surgery  [facility]

## 2012-02-01 NOTE — Transfer of Care (Signed)
Immediate Anesthesia Transfer of Care Note  Patient: Drew Ochoa  Procedure(s) Performed: Procedure(s) (LRB) with comments: TRANSANAL HEMORRHOIDAL DEARTERIALIZATION (N/A)  Patient Location: PACU  Anesthesia Type:General  Level of Consciousness: awake, alert , oriented and patient cooperative  Airway & Oxygen Therapy: Patient Spontanous Breathing and Patient connected to face mask oxygen  Post-op Assessment: Report given to PACU RN and Post -op Vital signs reviewed and stable  Post vital signs: Reviewed and stable  Complications: No apparent anesthesia complications

## 2012-02-01 NOTE — Progress Notes (Signed)
Patient awake and ambulated around Short Stay department. No nausea. Able to urinate very large amount. No c/o pain. Patient d/c home at 1830 with wife.

## 2012-02-04 ENCOUNTER — Encounter (HOSPITAL_COMMUNITY): Payer: Self-pay | Admitting: General Surgery

## 2012-02-04 ENCOUNTER — Telehealth (INDEPENDENT_AMBULATORY_CARE_PROVIDER_SITE_OTHER): Payer: Self-pay | Admitting: General Surgery

## 2012-02-04 NOTE — Telephone Encounter (Signed)
Left voicemail message for po-f/u appt for 02/22/12@2 :55pm

## 2012-02-22 ENCOUNTER — Encounter (INDEPENDENT_AMBULATORY_CARE_PROVIDER_SITE_OTHER): Payer: Self-pay | Admitting: General Surgery

## 2012-02-22 ENCOUNTER — Ambulatory Visit (INDEPENDENT_AMBULATORY_CARE_PROVIDER_SITE_OTHER): Payer: Commercial Indemnity | Admitting: General Surgery

## 2012-02-22 VITALS — BP 120/78 | HR 78 | Temp 98.3°F | Resp 18 | Wt 184.0 lb

## 2012-02-22 DIAGNOSIS — K648 Other hemorrhoids: Secondary | ICD-10-CM

## 2012-02-22 NOTE — Progress Notes (Signed)
Drew Ochoa is a 47 y.o. male who is status post a THD on 1/24.  He is doing well.  His symptoms resolved within about a week.  He denies any ongoing bleeding or pain.  He is having regular, soft bowel movements  Objective: Filed Vitals:   02/22/12 1506  BP: 120/78  Pulse: 78  Temp: 98.3 F (36.8 C)  Resp: 18    General appearance: alert, cooperative and no distress  Incision: healing well   Assessment: s/p  Patient Active Problem List  Diagnosis  . Iron deficiency anemia  . Hyperlipidemia  . Knee pain, bilateral    Plan: RTO PRN.  Continue high fiber diet indefinitely.    Vanita Panda, MD [facility], [place] [phone]   02/22/2012 3:13 PM

## 2012-02-22 NOTE — Patient Instructions (Signed)
Continue a high fiber diet.  Return to office as needed

## 2012-03-07 ENCOUNTER — Telehealth: Payer: Self-pay | Admitting: *Deleted

## 2012-03-07 NOTE — Telephone Encounter (Signed)
Message copied by Florene Glen on Fri Mar 07, 2012 10:47 AM ------      Message from: Florene Glen      Created: Thu Nov 22, 2011  3:44 PM       Pt needs ferritin, iron, tibc, cbc 3 months from 11/22/11 ------

## 2012-03-07 NOTE — Telephone Encounter (Signed)
Called pt to schedule repeat labs post Integra use. lmom for him to call back.

## 2012-03-21 NOTE — Telephone Encounter (Signed)
Left message for patient to call back  

## 2012-03-24 NOTE — Telephone Encounter (Signed)
Never heard from pt; mailed him a letter to contact us. Reminder in.

## 2012-04-07 ENCOUNTER — Telehealth: Payer: Self-pay | Admitting: Internal Medicine

## 2012-04-07 NOTE — Telephone Encounter (Signed)
Pt received my letter and confessed he never took the oral Integra. He was to have surgery in Dec., 2013 so he thought he would wait. He surgery was postponed to 02/01/12 and he never started the iron. Pt's H&H dropped 2 points in between our CBC and surgery. Pt will come in and discuss with Dr Rhea Belton on 04/15/12.

## 2012-04-10 ENCOUNTER — Encounter: Payer: Self-pay | Admitting: Internal Medicine

## 2012-04-15 ENCOUNTER — Other Ambulatory Visit (INDEPENDENT_AMBULATORY_CARE_PROVIDER_SITE_OTHER): Payer: Managed Care, Other (non HMO)

## 2012-04-15 ENCOUNTER — Encounter: Payer: Self-pay | Admitting: Internal Medicine

## 2012-04-15 ENCOUNTER — Ambulatory Visit (INDEPENDENT_AMBULATORY_CARE_PROVIDER_SITE_OTHER): Payer: Managed Care, Other (non HMO) | Admitting: Internal Medicine

## 2012-04-15 VITALS — BP 120/72 | HR 80 | Ht 68.0 in | Wt 188.0 lb

## 2012-04-15 DIAGNOSIS — Z8719 Personal history of other diseases of the digestive system: Secondary | ICD-10-CM

## 2012-04-15 DIAGNOSIS — D649 Anemia, unspecified: Secondary | ICD-10-CM

## 2012-04-15 DIAGNOSIS — D509 Iron deficiency anemia, unspecified: Secondary | ICD-10-CM

## 2012-04-15 DIAGNOSIS — K648 Other hemorrhoids: Secondary | ICD-10-CM

## 2012-04-15 DIAGNOSIS — Z9889 Other specified postprocedural states: Secondary | ICD-10-CM

## 2012-04-15 LAB — CBC WITH DIFFERENTIAL/PLATELET
Basophils Absolute: 0.1 10*3/uL (ref 0.0–0.1)
Lymphocytes Relative: 28.6 % (ref 12.0–46.0)
Lymphs Abs: 1.9 10*3/uL (ref 0.7–4.0)
Monocytes Relative: 10.5 % (ref 3.0–12.0)
Neutrophils Relative %: 58.4 % (ref 43.0–77.0)
Platelets: 326 10*3/uL (ref 150.0–400.0)
RDW: 15 % — ABNORMAL HIGH (ref 11.5–14.6)

## 2012-04-15 LAB — IBC PANEL
Iron: 24 ug/dL — ABNORMAL LOW (ref 42–165)
Saturation Ratios: 4.9 % — ABNORMAL LOW (ref 20.0–50.0)

## 2012-04-15 NOTE — Progress Notes (Signed)
  Subjective:    Patient ID: Drew Ochoa, male    DOB: 12-18-1965, 47 y.o.   MRN: [id number]  HPI 47 year old male known to me for evaluation of iron deficiency anemia. He underwent upper endoscopy, colonoscopy, and be a capsule endoscopy without revealing a cause for iron deficiency. He did have mild gastritis which was H. pylori negative. He was also found to have 2 rectal hyperplastic polyps and moderate-large internal hemorrhoids. He was treated with oral iron supplementation therapy and his iron deficiency resolved. Though around November 2013 his rectal bleeding returned. He was referred to Dr. Maisie Fus for consideration of hemorrhoidectomy.  And underwent internal hemorrhoidectomy in late January 2014. He recovered well from this surgery and has had no further rectal bleeding. Bowel habits have returned to normal. No abdominal pain. Good energy levels. No nausea or vomiting. No weight loss. No further trouble with fecal seepage.  He admits to never having taken iron supplementation.  He has just returned from a vacation in [place] with his wife.  Review of Systems As per history of present illness, otherwise negative  Current Medications, Allergies, Past Medical History, Past Surgical History, Family History and Social History were reviewed in [employer] record.     Objective:   Physical Exam BP 120/72  Pulse 80  Ht 5\' 8"  (1.727 m)  Wt 188 lb (85.276 kg)  BMI 28.59 kg/m2 Constitutional: Well appearing and in no acute distress HEENT: Normocephalic and atraumatic.   No scleral icterus. Neurological: Alert and oriented to person place and time. Psychiatric: Normal mood and affect. Behavior is normal.  CBC    Component Value Date/Time   WBC 6.9 01/24/2012 1525   RBC 4.58 01/24/2012 1525   HGB 12.7* 01/24/2012 1525   HCT 38.2* 01/24/2012 1525   PLT 350 01/24/2012 1525   MCV 83.4 01/24/2012 1525   MCH 27.7 01/24/2012 1525   MCHC 33.2 01/24/2012 1525   RDW 12.8  01/24/2012 1525   LYMPHSABS 1.4 11/20/2011 1053   MONOABS 0.5 11/20/2011 1053   EOSABS 0.1 11/20/2011 1053   BASOSABS 0.0 11/20/2011 1053    Iron/TIBC/Ferritin    Component Value Date/Time   IRON 52 11/20/2011 1053   TIBC 317 02/23/2011 1613   FERRITIN 15.2* 11/20/2011 1053       Assessment & Plan:  47 year old male with a past medical history of iron deficiency anemia and rectal bleeding felt secondary to large internal hemorrhoids status post internal hemorrhoidectomy in January 2014  1.  Internal hemorrhoids/iron def -- it is possibly chronic bleeding from internal hemorrhoids could lead to an iron deficiency. His other workup including upper endoscopy and small bowel capsule endoscopy were negative for a bleeding source. He does not have celiac disease. Clinically his bleeding is resolved after hemorrhoidectomy. We will repeat CBC in iron stores today to ensure his hemoglobin has normalized. If his iron studies remain low I will supplement with oral Integra and follow them to make sure they normalize. He is asked to call us if his bleeding returns in any way. He voices understanding.  If his CBC is normal along with his iron studies, he will return to Dr. Lenord Fellers and see [place] as needed

## 2012-04-15 NOTE — Patient Instructions (Addendum)
Your physician has requested that you go to the basement for  lab work before leaving today.  Follow up as needed  Depending on results we may prescribe Iron supplements                                                We are excited to introduce MyChart, a new best-in-class service that provides you online access to important information in your electronic medical record. We want to make it easier for you to view your health information - all in one secure location - when and where you need it. We expect MyChart will enhance the quality of care and service we provide.  When you register for MyChart, you can:    View your test results.    Request appointments and receive appointment reminders via email.    Request medication renewals.    View your medical history, allergies, medications and immunizations.    Communicate with your physician's office through a password-protected site.    Conveniently print information such as your medication lists.  To find out if MyChart is right for you, please talk to a member of our clinical staff today. We will gladly answer your questions about this free health and wellness tool.  If you are age 65 or older and want a member of your family to have access to your record, you must provide written consent by completing a proxy form available at our office. Please speak to our clinical staff about guidelines regarding accounts for patients younger than age 57.  As you activate your MyChart account and need any technical assistance, please call the MyChart technical support line at [phone]-[phone]) or email your question to [email or web address]. If you email your question(s), please include your name, a return phone number and the best time to reach you.  If you have non-urgent health-related questions, you can send a message to our office through MyChart at [email or web address]. If you have a medical emergency, call 911.  Thank you  for using MyChart as your new health and wellness resource!   MyChart licensed from [employer],  4540-9811. Patents Pending.

## 2012-04-16 LAB — FERRITIN: Ferritin: 6.4 ng/mL — ABNORMAL LOW (ref 22.0–322.0)

## 2012-04-17 ENCOUNTER — Other Ambulatory Visit: Payer: Self-pay | Admitting: *Deleted

## 2012-04-17 DIAGNOSIS — D509 Iron deficiency anemia, unspecified: Secondary | ICD-10-CM

## 2012-04-17 MED ORDER — INTEGRA 62.5-62.5-40-3 MG PO CAPS: ORAL_CAPSULE | ORAL | Status: DC

## 2012-05-01 ENCOUNTER — Telehealth: Payer: Self-pay | Admitting: Internal Medicine

## 2012-05-01 NOTE — Telephone Encounter (Signed)
Informed pt to stop the Integra and take an OTC 325mg  iron tab daily; pt stated understanding.

## 2012-05-01 NOTE — Telephone Encounter (Signed)
Okay, discontinue Integra Try over-the-counter iron 325 mg orally once daily. Have him call us if he develops any problems

## 2012-05-01 NOTE — Telephone Encounter (Signed)
The package insert lists diarrhea/loose stools as a side effect. Pt reports he has developed these problems. He has never tried Iron or OTC Iron before. Please advise. Thanks.

## 2012-06-24 ENCOUNTER — Ambulatory Visit (INDEPENDENT_AMBULATORY_CARE_PROVIDER_SITE_OTHER): Payer: Managed Care, Other (non HMO) | Admitting: Internal Medicine

## 2012-06-24 ENCOUNTER — Encounter: Payer: Self-pay | Admitting: Internal Medicine

## 2012-06-24 VITALS — BP 106/78 | HR 84 | Temp 98.7°F | Wt 191.0 lb

## 2012-06-24 DIAGNOSIS — M25569 Pain in unspecified knee: Secondary | ICD-10-CM

## 2012-06-24 DIAGNOSIS — M25469 Effusion, unspecified knee: Secondary | ICD-10-CM

## 2012-06-24 DIAGNOSIS — M228X2 Other disorders of patella, left knee: Secondary | ICD-10-CM

## 2012-06-24 DIAGNOSIS — M25462 Effusion, left knee: Secondary | ICD-10-CM

## 2012-06-24 DIAGNOSIS — S83422A Sprain of lateral collateral ligament of left knee, initial encounter: Secondary | ICD-10-CM

## 2012-06-24 DIAGNOSIS — M228X1 Other disorders of patella, right knee: Secondary | ICD-10-CM

## 2012-06-24 NOTE — Addendum Note (Signed)
Addended by: Judy Pimple on: 06/24/2012 01:04 PM   Modules accepted: Orders

## 2012-06-24 NOTE — Patient Instructions (Addendum)
Ice knee for 20 minutes twice daily. Keep knee elevated. We will try to schedule MRI of left knee

## 2012-06-24 NOTE — Progress Notes (Signed)
  Subjective:    Patient ID: Drew Ochoa, male    DOB: 1965-05-12, 47 y.o.   MRN: [id number]  HPI  patient went to see Dr. Roanna Epley recently regarding knee pain. He is very active physically and likes to bike and walk. He was told he had patellar tracking syndrome. However he tried to bike some on Saturday, June 14 but could not make it very far because of left knee pain. He did walk an hour and a half on Sunday. By Sunday evening his left knee was very swollen. Says it is less swollen today than yesterday. He's concerned about swelling.    Review of Systems     Objective:   Physical Exam he has a ballotable effusion left knee. Joint line is not tender. No tenderness along the medial collateral ligament. Tender all along upper lateral collateral ligament. He does have patellar tracking syndrome bilaterally.        Assessment & Plan:  Left knee effusion  Palpable tenderness along upper lateral collateral ligament-? Internal derangement left knee  Plan: Advise patient to ice knee for 20 minutes twice daily and keep it elevated. MRI of left knee. Because of history of anemia, hesitate to prescribe ibuprofen.

## 2012-06-29 ENCOUNTER — Other Ambulatory Visit: Payer: Managed Care, Other (non HMO)

## 2012-07-01 ENCOUNTER — Ambulatory Visit
Admission: RE | Admit: 2012-07-01 | Discharge: 2012-07-01 | Disposition: A | Discharge status: Home / Self Care | Payer: Managed Care, Other (non HMO) | Source: Ambulatory Visit | Attending: Internal Medicine | Admitting: Internal Medicine

## 2012-07-03 ENCOUNTER — Telehealth: Payer: Self-pay

## 2012-07-03 NOTE — Telephone Encounter (Signed)
Patient scheduled for an appointment with Dr. Teryl Lucy on 07/07/2012 at 9:30am. Informed of this.

## 2012-07-17 ENCOUNTER — Telehealth: Payer: Self-pay | Admitting: *Deleted

## 2012-07-17 NOTE — Telephone Encounter (Signed)
Message copied by Florene Glen on Thu Jul 17, 2012  7:55 AM ------      Message from: Daphine Deutscher      Created: Thu Apr 17, 2012 11:07 AM       Call and remind patient due for labs for Pyrtle 07/21/12. ------

## 2012-07-28 NOTE — Telephone Encounter (Signed)
lmom for pt to call back

## 2012-07-29 NOTE — Telephone Encounter (Signed)
Pt left me a message stating he had a problem with Integra and was switched to regular po iron 325mg . Called pt and informed him he was correct, but we need to know if the iron is working. Pt stated understanding and will come at his convenience.

## 2012-07-31 ENCOUNTER — Other Ambulatory Visit (INDEPENDENT_AMBULATORY_CARE_PROVIDER_SITE_OTHER): Payer: Managed Care, Other (non HMO)

## 2012-07-31 DIAGNOSIS — D509 Iron deficiency anemia, unspecified: Secondary | ICD-10-CM

## 2012-07-31 LAB — CBC WITH DIFFERENTIAL/PLATELET
Basophils Absolute: 0.1 10*3/uL (ref 0.0–0.1)
Basophils Relative: 0.7 % (ref 0.0–3.0)
Eosinophils Absolute: 0.1 10*3/uL (ref 0.0–0.7)
Hemoglobin: 15.1 g/dL (ref 13.0–17.0)
Lymphocytes Relative: 33.8 % (ref 12.0–46.0)
MCHC: 34.2 g/dL (ref 30.0–36.0)
Monocytes Relative: 10.5 % (ref 3.0–12.0)
Neutro Abs: 3.7 10*3/uL (ref 1.4–7.7)
Neutrophils Relative %: 53.3 % (ref 43.0–77.0)
RBC: 5.26 Mil/uL (ref 4.22–5.81)
RDW: 16.6 % — ABNORMAL HIGH (ref 11.5–14.6)

## 2012-08-01 LAB — IBC PANEL
Iron: 62 ug/dL (ref 42–165)
Saturation Ratios: 15.9 % — ABNORMAL LOW (ref 20.0–50.0)
Transferrin: 278.8 mg/dL (ref 212.0–360.0)

## 2012-08-04 ENCOUNTER — Telehealth: Payer: Self-pay | Admitting: Internal Medicine

## 2012-08-04 NOTE — Telephone Encounter (Signed)
Knees are still bothering him. Recommend that he return to orthopedist Dr. Dion Saucier for reevaluation.

## 2012-08-25 ENCOUNTER — Ambulatory Visit (INDEPENDENT_AMBULATORY_CARE_PROVIDER_SITE_OTHER): Payer: Managed Care, Other (non HMO) | Admitting: Internal Medicine

## 2012-08-25 ENCOUNTER — Encounter: Payer: Self-pay | Admitting: Internal Medicine

## 2012-08-25 VITALS — BP 112/80 | HR 80 | Wt 196.0 lb

## 2012-08-25 DIAGNOSIS — M25519 Pain in unspecified shoulder: Secondary | ICD-10-CM

## 2012-08-25 DIAGNOSIS — M25511 Pain in right shoulder: Secondary | ICD-10-CM

## 2012-08-25 LAB — CBC WITH DIFFERENTIAL/PLATELET
Basophils Absolute: 0 10*3/uL (ref 0.0–0.1)
Basophils Relative: 1 % (ref 0–1)
Eosinophils Absolute: 0.2 10*3/uL (ref 0.0–0.7)
Hemoglobin: 15.6 g/dL (ref 13.0–17.0)
MCH: 29 pg (ref 26.0–34.0)
MCHC: 34.7 g/dL (ref 30.0–36.0)
Monocytes Relative: 11 % (ref 3–12)
Neutro Abs: 3.1 10*3/uL (ref 1.7–7.7)
Neutrophils Relative %: 53 % (ref 43–77)
Platelets: 285 10*3/uL (ref 150–400)

## 2012-08-26 LAB — SEDIMENTATION RATE: Sed Rate: 1 mm/hr (ref 0–16)

## 2012-08-26 LAB — ANA: Anti Nuclear Antibody(ANA): NEGATIVE

## 2012-09-07 NOTE — Progress Notes (Signed)
  Subjective:    Patient ID: Drew Ochoa, male    DOB: Dec 19, 1965, 47 y.o.   MRN: [id number]  HPI 47 year old white male in today complaining of right shoulder popping and discomfort. This is been going on for some time. Has pain when raising right arm up over his head. Doesn't recall any specific injury.    Review of Systems     Objective:   Physical Exam Popping noted when he extends right upper extremity behind his back. Muscle strength in the shoulder and right upper extremity appears to be normal. Deep tendon reflexes in the right upper extremity is normal.       Assessment & Plan:  Right shoulder arthropathy-possible impingement consider rotator cuff tear  Plan: Refer to Dr. Teressa Senter for evaluation

## 2012-09-07 NOTE — Patient Instructions (Addendum)
You will be referred to hand surgeon for further evaluation of shoulder.

## 2012-09-12 ENCOUNTER — Other Ambulatory Visit: Payer: Self-pay | Admitting: Orthopedic Surgery

## 2012-09-16 ENCOUNTER — Other Ambulatory Visit: Payer: Managed Care, Other (non HMO) | Admitting: Internal Medicine

## 2012-09-16 ENCOUNTER — Encounter (HOSPITAL_BASED_OUTPATIENT_CLINIC_OR_DEPARTMENT_OTHER): Payer: Self-pay | Admitting: *Deleted

## 2012-09-16 DIAGNOSIS — D509 Iron deficiency anemia, unspecified: Secondary | ICD-10-CM

## 2012-09-16 DIAGNOSIS — Z0000 Encounter for general adult medical examination without abnormal findings: Secondary | ICD-10-CM

## 2012-09-16 DIAGNOSIS — Z13220 Encounter for screening for lipoid disorders: Secondary | ICD-10-CM

## 2012-09-16 LAB — CBC WITH DIFFERENTIAL/PLATELET
Basophils Absolute: 0 10*3/uL (ref 0.0–0.1)
Eosinophils Relative: 2 % (ref 0–5)
Lymphocytes Relative: 32 % (ref 12–46)
MCV: 85.5 fL (ref 78.0–100.0)
Neutro Abs: 2.8 10*3/uL (ref 1.7–7.7)
Platelets: 263 10*3/uL (ref 150–400)
RDW: 14.6 % (ref 11.5–15.5)
WBC: 4.9 10*3/uL (ref 4.0–10.5)

## 2012-09-16 LAB — COMPREHENSIVE METABOLIC PANEL
ALT: 17 U/L (ref 0–53)
AST: 15 U/L (ref 0–37)
Alkaline Phosphatase: 43 U/L (ref 39–117)
Calcium: 8.9 mg/dL (ref 8.4–10.5)
Chloride: 105 mEq/L (ref 96–112)
Creat: 1.14 mg/dL (ref 0.50–1.35)

## 2012-09-16 LAB — LIPID PANEL
LDL Cholesterol: 117 mg/dL — ABNORMAL HIGH (ref 0–99)
Total CHOL/HDL Ratio: 3.1 Ratio
VLDL: 12 mg/dL (ref 0–40)

## 2012-09-16 NOTE — Progress Notes (Signed)
No labs needed-dr baxley did routine labs today for physical

## 2012-09-19 ENCOUNTER — Other Ambulatory Visit: Payer: Managed Care, Other (non HMO) | Admitting: Internal Medicine

## 2012-09-22 ENCOUNTER — Encounter: Payer: Self-pay | Admitting: Internal Medicine

## 2012-09-22 ENCOUNTER — Ambulatory Visit (INDEPENDENT_AMBULATORY_CARE_PROVIDER_SITE_OTHER): Payer: Managed Care, Other (non HMO) | Admitting: Internal Medicine

## 2012-09-22 VITALS — BP 116/84 | HR 80 | Temp 97.9°F | Ht 68.0 in | Wt 193.0 lb

## 2012-09-22 DIAGNOSIS — Z8639 Personal history of other endocrine, nutritional and metabolic disease: Secondary | ICD-10-CM

## 2012-09-22 DIAGNOSIS — M2240 Chondromalacia patellae, unspecified knee: Secondary | ICD-10-CM

## 2012-09-22 DIAGNOSIS — Z87898 Personal history of other specified conditions: Secondary | ICD-10-CM

## 2012-09-22 DIAGNOSIS — M2241 Chondromalacia patellae, right knee: Secondary | ICD-10-CM

## 2012-09-22 DIAGNOSIS — Z0000 Encounter for general adult medical examination without abnormal findings: Secondary | ICD-10-CM

## 2012-09-22 DIAGNOSIS — E7800 Pure hypercholesterolemia, unspecified: Secondary | ICD-10-CM

## 2012-09-22 DIAGNOSIS — Z8719 Personal history of other diseases of the digestive system: Secondary | ICD-10-CM

## 2012-09-22 DIAGNOSIS — Z862 Personal history of diseases of the blood and blood-forming organs and certain disorders involving the immune mechanism: Secondary | ICD-10-CM

## 2012-09-22 DIAGNOSIS — M75101 Unspecified rotator cuff tear or rupture of right shoulder, not specified as traumatic: Secondary | ICD-10-CM

## 2012-09-22 LAB — POCT URINALYSIS DIPSTICK
Bilirubin, UA: NEGATIVE
Blood, UA: NEGATIVE
Glucose, UA: NEGATIVE
Ketones, UA: NEGATIVE
Spec Grav, UA: 1.01

## 2012-09-22 NOTE — Patient Instructions (Addendum)
Return in one year. Have flu vaccine at work. Good luck with right rotator cuff surgery.

## 2012-09-22 NOTE — H&P (Signed)
Drew Ochoa is an 47 y.o. male.   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  He has enjoyed playing softball for more than 20 years and played Jr. High and high school baseball.  He played catcher position.  Because of this he has sore knees with chondromalacia and has a right sore shoulder.  He threw many hard pitches from a squatting position causing excessive stress on his right rotator cuff.    He used to enjoy playing golf and tennis, but has stopped playing tennis for two years due to shoulder pain and has not played golf at all this summer.  He reports crepitation with right shoulder motion, loss of motion, weakness of scaption and forward flexion, difficulty sleeping on the shoulder at night.  He seeks an upper extremity orthopaedic consult.  Past Medical History  Diagnosis Date  . Iron deficiency anemia   . Hyperlipidemia   . Hemorrhoids   . Weakness   . Blood in stool   . Bronchitis     hx of   . Colon polyps   . Complication of anesthesia     hard to wake up, "slept too long"  . Complication of anesthesia     problems urinating    Past Surgical History  Procedure Laterality Date  . Knee surgery  2006    right  . Nose surgery      cyst removal   . Colonoscopy    . Esophagogastroduodenoscopy endoscopy    . Transanal hemorrhoidal dearterialization  02/01/2012    Procedure: TRANSANAL HEMORRHOIDAL DEARTERIALIZATION;  Surgeon: Romie Levee, MD;  Location: [facility];  Service: General;  Laterality: N/A;  . Back surgery  2008    cyst removal   . Eye surgery      cyst removal -eyelid 3rd grade    Family History  Problem Relation Age of Onset  . Mental illness Mother   . Arthritis Father   . Diabetes Maternal Uncle   . Alzheimer's disease Maternal Uncle   . Depression Father   . Colon polyps Father   . Skin cancer Mother    Social History:   reports that he has never smoked. He has never used smokeless tobacco. He reports that  drinks alcohol. He reports that he does not use illicit drugs.  Allergies: No Known Allergies  No prescriptions prior to admission    No results found for this or any previous visit (from the past 48 hour(s)).  No results found.   Pertinent items are noted in HPI.  Height 5\' 8"  (1.727 m), weight 86.183 kg (190 lb).  General appearance: alert Head: Normocephalic, without obvious abnormality Neck: supple, symmetrical, trachea midline Resp: clear to auscultation bilaterally Cardio: regular rate and rhythm GI: normal findings: bowel sounds normal Extremities: He has shoulder range of motion, combined elevation right shoulder 165, left shoulder 175, external rotation 90 degrees abduction right shoulder 85, left shoulder 95.  He can internally rotate to at least T-12 on the right and approximately T-8 left.  He does not show signs of adhesive capsulitis. He does, however, have dyssynergy and impingement on the right side.  He has a positive Hawkins maneuver.  He has pain with rapid abduction, isolated muscle testing reveals weakness of forward flexion, scaption, external rotation against resistance.  He has crepitation beneath the acromion and AC joint.   Plain films of the shoulder reveal  a type III acromion.  He has arthritis at the [facility] joint with inferior osteophyte at the medial acromion and distal clavicle.  He had an MRI of the right shoulder on 09/04/12.  This reveals very unfavorable AC anatomy, a type III acromion, a prominent distal clavicle and a full thickness retracting supraspinatus rotator cuff tear.   Pulses: 2+ and symmetric Skin: mobility and turgor normal Neurologic: Grossly normal    Assessment/Plan Impression: Right shoulder impingement with AC arthrosis and RC tear  Plan: To the OR for right SA with SAD/DCR and RC repair.The procedure, risks,benefits and post-op course were  discussed with the patient at length and they were in agreement with the plan.  DASNOIT,Rylinn Linzy J 09/22/2012, 1:02 PM   H&P documentation: 09/23/2012  -History and Physical Reviewed  -Patient has been re-examined  -No change in the plan of care  Wyn Forster, MD

## 2012-09-23 ENCOUNTER — Ambulatory Visit (HOSPITAL_BASED_OUTPATIENT_CLINIC_OR_DEPARTMENT_OTHER): Payer: Managed Care, Other (non HMO) | Admitting: Anesthesiology

## 2012-09-23 ENCOUNTER — Encounter (HOSPITAL_BASED_OUTPATIENT_CLINIC_OR_DEPARTMENT_OTHER): Payer: Self-pay | Admitting: Orthopedic Surgery

## 2012-09-23 ENCOUNTER — Encounter (HOSPITAL_BASED_OUTPATIENT_CLINIC_OR_DEPARTMENT_OTHER)
Admission: RE | Disposition: A | Discharge status: Home / Self Care | Payer: Self-pay | Source: Ambulatory Visit | Attending: Orthopedic Surgery

## 2012-09-23 ENCOUNTER — Ambulatory Visit (HOSPITAL_BASED_OUTPATIENT_CLINIC_OR_DEPARTMENT_OTHER)
Admission: RE | Admit: 2012-09-23 | Discharge: 2012-09-23 | Disposition: A | Discharge status: Home / Self Care | Payer: Managed Care, Other (non HMO) | Source: Ambulatory Visit | Attending: Orthopedic Surgery | Admitting: Orthopedic Surgery

## 2012-09-23 ENCOUNTER — Encounter (HOSPITAL_BASED_OUTPATIENT_CLINIC_OR_DEPARTMENT_OTHER): Payer: Self-pay | Admitting: Anesthesiology

## 2012-09-23 DIAGNOSIS — M19019 Primary osteoarthritis, unspecified shoulder: Secondary | ICD-10-CM | POA: Insufficient documentation

## 2012-09-23 DIAGNOSIS — M67919 Unspecified disorder of synovium and tendon, unspecified shoulder: Secondary | ICD-10-CM | POA: Insufficient documentation

## 2012-09-23 DIAGNOSIS — M719 Bursopathy, unspecified: Secondary | ICD-10-CM | POA: Insufficient documentation

## 2012-09-23 DIAGNOSIS — M25819 Other specified joint disorders, unspecified shoulder: Secondary | ICD-10-CM | POA: Insufficient documentation

## 2012-09-23 DIAGNOSIS — M659 Unspecified synovitis and tenosynovitis, unspecified site: Secondary | ICD-10-CM | POA: Insufficient documentation

## 2012-09-23 HISTORY — PX: SHOULDER ARTHROSCOPY WITH ROTATOR CUFF REPAIR AND SUBACROMIAL DECOMPRESSION: SHX5686

## 2012-09-23 LAB — POCT HEMOGLOBIN-HEMACUE: Hemoglobin: 16.4 g/dL (ref 13.0–17.0)

## 2012-09-23 SURGERY — SHOULDER ARTHROSCOPY WITH ROTATOR CUFF REPAIR AND SUBACROMIAL DECOMPRESSION
Anesthesia: Regional | Site: Shoulder | Laterality: Right | Wound class: Clean

## 2012-09-23 MED ORDER — HYDROMORPHONE HCL PF 1 MG/ML IJ SOLN: 0.2500 mg | INTRAMUSCULAR | Status: DC | PRN

## 2012-09-23 MED ORDER — PROPOFOL INFUSION 10 MG/ML OPTIME
INTRAVENOUS | Status: DC | PRN
  Administered 2012-09-23: 100 ug/kg/min via INTRAVENOUS

## 2012-09-23 MED ORDER — SUCCINYLCHOLINE CHLORIDE 20 MG/ML IJ SOLN
INTRAMUSCULAR | Status: DC | PRN
  Administered 2012-09-23: 140 mg via INTRAVENOUS

## 2012-09-23 MED ORDER — CHLORHEXIDINE GLUCONATE 4 % EX LIQD: 60.0000 mL | Freq: Once | CUTANEOUS | Status: DC

## 2012-09-23 MED ORDER — DEXAMETHASONE SODIUM PHOSPHATE 4 MG/ML IJ SOLN
INTRAMUSCULAR | Status: DC | PRN
  Administered 2012-09-23: 4 mg

## 2012-09-23 MED ORDER — PROPOFOL 10 MG/ML IV BOLUS
INTRAVENOUS | Status: DC | PRN
  Administered 2012-09-23: 200 mg via INTRAVENOUS
  Administered 2012-09-23 (×2): 20 mg via INTRAVENOUS

## 2012-09-23 MED ORDER — ONDANSETRON HCL 4 MG/2ML IJ SOLN
INTRAMUSCULAR | Status: DC | PRN
  Administered 2012-09-23: 4 mg via INTRAVENOUS

## 2012-09-23 MED ORDER — OXYCODONE HCL 5 MG/5ML PO SOLN: 5.0000 mg | Freq: Once | ORAL | Status: DC | PRN

## 2012-09-23 MED ORDER — FENTANYL CITRATE 0.05 MG/ML IJ SOLN
INTRAMUSCULAR | Status: DC | PRN
  Administered 2012-09-23: 50 ug via INTRAVENOUS
  Administered 2012-09-23 (×2): 25 ug via INTRAVENOUS

## 2012-09-23 MED ORDER — MIDAZOLAM HCL 2 MG/2ML IJ SOLN
1.0000 mg | INTRAMUSCULAR | Status: DC | PRN
  Administered 2012-09-23: 2 mg via INTRAVENOUS

## 2012-09-23 MED ORDER — PROMETHAZINE HCL 25 MG/ML IJ SOLN: 6.2500 mg | INTRAMUSCULAR | Status: DC | PRN

## 2012-09-23 MED ORDER — CEPHALEXIN 500 MG PO CAPS: 500.0000 mg | ORAL_CAPSULE | Freq: Three times a day (TID) | ORAL | Status: DC

## 2012-09-23 MED ORDER — IBUPROFEN 600 MG PO TABS: 600.0000 mg | ORAL_TABLET | Freq: Four times a day (QID) | ORAL | Status: DC | PRN

## 2012-09-23 MED ORDER — OXYCODONE HCL 5 MG PO TABS: 5.0000 mg | ORAL_TABLET | Freq: Once | ORAL | Status: DC | PRN

## 2012-09-23 MED ORDER — FENTANYL CITRATE 0.05 MG/ML IJ SOLN
50.0000 ug | Freq: Once | INTRAMUSCULAR | Status: AC
  Administered 2012-09-23: 100 ug via INTRAVENOUS

## 2012-09-23 MED ORDER — CEFAZOLIN SODIUM-DEXTROSE 2-3 GM-% IV SOLR
2.0000 g | INTRAVENOUS | Status: AC
  Administered 2012-09-23: 2 g via INTRAVENOUS

## 2012-09-23 MED ORDER — LACTATED RINGERS IV SOLN
INTRAVENOUS | Status: DC
  Administered 2012-09-23 (×3): via INTRAVENOUS

## 2012-09-23 MED ORDER — HYDROMORPHONE HCL 2 MG PO TABS: ORAL_TABLET | ORAL | Status: DC

## 2012-09-23 MED ORDER — LIDOCAINE HCL (CARDIAC) 20 MG/ML IV SOLN
INTRAVENOUS | Status: DC | PRN
  Administered 2012-09-23: 100 mg via INTRAVENOUS

## 2012-09-23 MED ORDER — BUPIVACAINE-EPINEPHRINE PF 0.25-1:200000 % IJ SOLN
INTRAMUSCULAR | Status: DC | PRN
  Administered 2012-09-23: 25 mL

## 2012-09-23 SURGICAL SUPPLY — 75 items
ANCHOR SUT BIO SW 4.75 W/FIB (Anchor) ×2 IMPLANT
BANDAGE ADHESIVE 1X3 (GAUZE/BANDAGES/DRESSINGS) IMPLANT
BLADE AVERAGE 25X9 (BLADE) IMPLANT
BLADE CUTTER MENIS 5.5 (BLADE) IMPLANT
BLADE SURG 15 STRL LF DISP TIS (BLADE) ×2 IMPLANT
BLADE SURG 15 STRL SS (BLADE) ×2
BUR EGG 3PK/BX (BURR) IMPLANT
BUR OVAL 6.0 (BURR) ×2 IMPLANT
CANISTER OMNI JUG 16 LITER (MISCELLANEOUS) ×2 IMPLANT
CANISTER SUCTION 2500CC (MISCELLANEOUS) IMPLANT
CANNULA TWIST IN 8.25X7CM (CANNULA) ×2 IMPLANT
CLEANER CAUTERY TIP 5X5 PAD (MISCELLANEOUS) IMPLANT
CLOTH BEACON ORANGE TIMEOUT ST (SAFETY) ×2 IMPLANT
CUTTER MENISCUS  4.2MM (BLADE) ×1
CUTTER MENISCUS 4.2MM (BLADE) ×1 IMPLANT
DECANTER SPIKE VIAL GLASS SM (MISCELLANEOUS) IMPLANT
DRAPE INCISE IOBAN 66X45 STRL (DRAPES) ×2 IMPLANT
DRAPE STERI 35X30 U-POUCH (DRAPES) ×2 IMPLANT
DRAPE SURG 17X23 STRL (DRAPES) ×2 IMPLANT
DRAPE U-SHAPE 47X51 STRL (DRAPES) ×2 IMPLANT
DRAPE U-SHAPE 76X120 STRL (DRAPES) ×4 IMPLANT
DRSG PAD ABDOMINAL 8X10 ST (GAUZE/BANDAGES/DRESSINGS) ×2 IMPLANT
DURAPREP 26ML APPLICATOR (WOUND CARE) ×2 IMPLANT
ELECT REM PT RETURN 9FT ADLT (ELECTROSURGICAL)
ELECTRODE REM PT RTRN 9FT ADLT (ELECTROSURGICAL) IMPLANT
GLOVE BIOGEL M STRL SZ7.5 (GLOVE) ×2 IMPLANT
GLOVE BIOGEL PI IND STRL 8 (GLOVE) ×2 IMPLANT
GLOVE BIOGEL PI INDICATOR 8 (GLOVE) ×2
GLOVE ORTHO TXT STRL SZ7.5 (GLOVE) ×2 IMPLANT
GOWN BRE IMP PREV XXLGXLNG (GOWN DISPOSABLE) ×4 IMPLANT
GOWN STRL REIN 2XL XLG LVL4 (GOWN DISPOSABLE) ×2 IMPLANT
NDL SUT 6 .5 CRC .975X.05 MAYO (NEEDLE) IMPLANT
NEEDLE MAYO TAPER (NEEDLE)
NEEDLE MINI RC 24MM (NEEDLE) IMPLANT
NEEDLE SCORPION (NEEDLE) ×2 IMPLANT
PACK ARTHROSCOPY DSU (CUSTOM PROCEDURE TRAY) ×2 IMPLANT
PACK BASIN DAY SURGERY FS (CUSTOM PROCEDURE TRAY) ×2 IMPLANT
PAD CLEANER CAUTERY TIP 5X5 (MISCELLANEOUS)
PASSER SUT SWANSON 36MM LOOP (INSTRUMENTS) IMPLANT
PENCIL BUTTON HOLSTER BLD 10FT (ELECTRODE) IMPLANT
SLEEVE SCD COMPRESS KNEE MED (MISCELLANEOUS) ×2 IMPLANT
SLING ARM FOAM STRAP LRG (SOFTGOODS) IMPLANT
SLING ARM FOAM STRAP MED (SOFTGOODS) IMPLANT
SPONGE GAUZE 4X4 12PLY (GAUZE/BANDAGES/DRESSINGS) ×2 IMPLANT
SPONGE LAP 4X18 X RAY DECT (DISPOSABLE) IMPLANT
STRIP CLOSURE SKIN 1/2X4 (GAUZE/BANDAGES/DRESSINGS) IMPLANT
SUCTION FRAZIER TIP 10 FR DISP (SUCTIONS) IMPLANT
SUT ETHIBOND 2 OS 4 DA (SUTURE) IMPLANT
SUT ETHILON 4 0 PS 2 18 (SUTURE) IMPLANT
SUT FIBERWIRE #2 38 T-5 BLUE (SUTURE)
SUT FIBERWIRE 3-0 18 TAPR NDL (SUTURE)
SUT LASSO 45 DEGREE (SUTURE) ×2 IMPLANT
SUT PROLENE 1 CT (SUTURE) IMPLANT
SUT PROLENE 3 0 PS 2 (SUTURE) ×2 IMPLANT
SUT TIGER TAPE 7 IN WHITE (SUTURE) IMPLANT
SUT VIC AB 0 CT1 27 (SUTURE)
SUT VIC AB 0 CT1 27XBRD ANBCTR (SUTURE) IMPLANT
SUT VIC AB 0 SH 27 (SUTURE) IMPLANT
SUT VIC AB 2-0 SH 27 (SUTURE)
SUT VIC AB 2-0 SH 27XBRD (SUTURE) IMPLANT
SUT VIC AB 3-0 SH 27 (SUTURE)
SUT VIC AB 3-0 SH 27X BRD (SUTURE) IMPLANT
SUT VIC AB 3-0 X1 27 (SUTURE) IMPLANT
SUTURE FIBERWR #2 38 T-5 BLUE (SUTURE) IMPLANT
SUTURE FIBERWR 3-0 18 TAPR NDL (SUTURE) IMPLANT
SYR 3ML 23GX1 SAFETY (SYRINGE) IMPLANT
SYR BULB 3OZ (MISCELLANEOUS) IMPLANT
TAPE FIBER 2MM 7IN #2 BLUE (SUTURE) ×2 IMPLANT
TAPE PAPER 3X10 WHT MICROPORE (GAUZE/BANDAGES/DRESSINGS) ×2 IMPLANT
TOWEL OR 17X24 6PK STRL BLUE (TOWEL DISPOSABLE) ×2 IMPLANT
TUBE CONNECTING 20X1/4 (TUBING) ×2 IMPLANT
TUBING ARTHROSCOPY IRRIG 16FT (MISCELLANEOUS) ×2 IMPLANT
WAND STAR VAC 90 (SURGICAL WAND) ×2 IMPLANT
WATER STERILE IRR 1000ML POUR (IV SOLUTION) ×2 IMPLANT
YANKAUER SUCT BULB TIP NO VENT (SUCTIONS) IMPLANT

## 2012-09-23 NOTE — Anesthesia Postprocedure Evaluation (Signed)
  Anesthesia Post-op Note  Patient: Drew Ochoa  Procedure(s) Performed: Procedure(s): RIGHT SHOULDER ARTHROSCOPY WITH ROTATOR CUFF REPAIR AND SUBACROMIAL DECOMPRESSION AND DISTAL CLAVICLE RESECTION (Right)  Patient Location: PACU  Anesthesia Type:GA combined with regional for post-op pain  Level of Consciousness: awake, alert  and oriented  Airway and Oxygen Therapy: Patient Spontanous Breathing  Post-op Pain: none  Post-op Assessment: Post-op Vital signs reviewed, Patient's Cardiovascular Status Stable, Respiratory Function Stable, Patent Airway and No signs of Nausea or vomiting  Post-op Vital Signs: Reviewed and stable  Complications: No apparent anesthesia complications

## 2012-09-23 NOTE — Anesthesia Procedure Notes (Addendum)
Anesthesia Regional Block:  Interscalene brachial plexus block  Pre-Anesthetic Checklist: ,, timeout performed, Correct Patient, Correct Site, Correct Laterality, Correct Procedure, Correct Position, site marked, Risks and benefits discussed,  Surgical consent,  Pre-op evaluation,  At surgeon's request and post-op pain management  Laterality: Right  Prep: chloraprep       Needles:  Injection technique: Single-shot  Needle Type: Echogenic Stimulator Needle     Needle Length: 5cm 5 cm Needle Gauge: 22 and 22 G    Additional Needles:  Procedures: ultrasound guided (picture in chart) and nerve stimulator Interscalene brachial plexus block  Nerve Stimulator or Paresthesia:  Response: 0.5 mA,   Additional Responses:   Narrative:  Start time: 09/23/2012 10:55 AM End time: 09/23/2012 11:05 AM Injection made incrementally with aspirations every 5 mL. Anesthesiologist: Dr Gypsy Balsam  Additional Notes: [phone] R ISB POP CHG prep, sterile tech Single stick w/stim down to .5ma and good [place] visualization-PIX in chart Vernia Buff .5% w//epi 1:200000 total 25cc+decadron 4mg  infiltrated Multiple neg asp No compl Dr Gypsy Balsam     Procedure Name: Intubation Date/Time: 09/23/2012 12:10 PM Performed by: Meyer Russel Pre-anesthesia Checklist: Patient identified, Emergency Drugs available, Suction available and Patient being monitored Patient Re-evaluated:Patient Re-evaluated prior to inductionOxygen Delivery Method: Circle System Utilized Preoxygenation: Pre-oxygenation with 100% oxygen Intubation Type: IV induction Ventilation: Mask ventilation without difficulty Laryngoscope Size: Miller and 2 Grade View: Grade I Tube type: Oral Tube size: 8.0 mm Number of attempts: 1 Airway Equipment and Method: stylet Placement Confirmation: ETT inserted through vocal cords under direct vision,  positive ETCO2 and breath sounds checked- equal and bilateral Secured at: 23 cm Tube secured with:  Tape Dental Injury: Teeth and Oropharynx as per pre-operative assessment

## 2012-09-23 NOTE — Transfer of Care (Signed)
Immediate Anesthesia Transfer of Care Note  Patient: Drew Ochoa  Procedure(s) Performed: Procedure(s): RIGHT SHOULDER ARTHROSCOPY WITH ROTATOR CUFF REPAIR AND SUBACROMIAL DECOMPRESSION AND DISTAL CLAVICLE RESECTION (Right)  Patient Location: PACU  Anesthesia Type:GA combined with regional for post-op pain  Level of Consciousness: awake, alert  and oriented  Airway & Oxygen Therapy: Patient Spontanous Breathing and Patient connected to face mask oxygen  Post-op Assessment: Report given to PACU RN, Post -op Vital signs reviewed and stable and Patient moving all extremities  Post vital signs: Reviewed and stable  Complications: No apparent anesthesia complications

## 2012-09-23 NOTE — Progress Notes (Signed)
Assisted Dr. Kasik with right, ultrasound guided, interscalene  block. Side rails up, monitors on throughout procedure. See vital signs in flow sheet. Tolerated Procedure well. 

## 2012-09-23 NOTE — Brief Op Note (Signed)
09/23/2012  1:46 PM  PATIENT:  Drew Ochoa  47 y.o. male  PRE-OPERATIVE DIAGNOSIS:  RIGHT SHOULDER ROTATOR CUFF TEAR, A-C DJD Type 3 acromion  POST-OPERATIVE DIAGNOSIS:  90% BURSAL SIDE SUPRASPINATUS CUFF TEAR, UNFAVORABLE ACROMIAL MORPHOLOGY, A-C DJD AND SYNOVITIS  PROCEDURE:  Procedure(s): RIGHT SHOULDER ARTHROSCOPY WITH DEBRIDEMENT, ROTATOR CUFF REPAIR AND SUBACROMIAL DECOMPRESSION AND DISTAL CLAVICLE RESECTION  SURGEON:  Surgeon(s): Wyn Forster., MD  PHYSICIAN ASSISTANT:   ASSISTANTS: Mallory Shirk.A-C    ANESTHESIA:   general  EBL:  Total I/O In: 1000 [I.V.:1000] Out: -   DRAINS: none   LOCAL MEDICATIONS USED:  MARCAINE    and BUPIVICAINE   SPECIMEN:  No Specimen  DISPOSITION OF SPECIMEN:  N/A  COUNTS:  YES  TOURNIQUET:  * No tourniquets in log *  DICTATION: .Other Dictation: Dictation Number [id number]  PLAN OF CARE: Discharge to home after PACU    Delay start of Pharmacological VTE agent (>24hrs) due to surgical blood loss or risk of bleeding:  not applicable              Drew Ochoa is a 47 y.o. male patient.  No diagnosis found. Past Medical History  Diagnosis Date  . Iron deficiency anemia   . Hyperlipidemia   . Hemorrhoids   . Weakness   . Blood in stool   . Bronchitis     hx of   . Colon polyps   . Complication of anesthesia     hard to wake up, "slept too long"  . Complication of anesthesia     problems urinating   No past surgical history pertinent negatives on file. Scheduled Meds: . chlorhexidine  60 mL Topical Once   Continuous Infusions: . lactated ringers 20 mL/hr at 09/23/12 1045   PRN Meds:midazolam  No Known Allergies Active Problems:   * No active hospital problems. *  Blood pressure 112/76, pulse 76, temperature 98.3 F (36.8 C), temperature source Oral, resp. rate 17, height 5\' 8"  (1.727 m), weight 86.183 kg (190 lb), SpO2 94.00%.  @IPPOSUB @ @IPPOOBJ @ @IPPOAP @  Drew Ochoa,Drew Ochoa  V 09/23/2012

## 2012-09-23 NOTE — Op Note (Signed)
[id number] 

## 2012-09-23 NOTE — Anesthesia Preprocedure Evaluation (Signed)
Anesthesia Evaluation  Patient identified by MRN, date of birth, ID band Patient awake    Reviewed: Allergy & Precautions, H&P , NPO status , Patient's Chart, lab work & pertinent test results  Airway Mallampati: II TM Distance: >3 FB Neck ROM: Full    Dental   Pulmonary  breath sounds clear to auscultation        Cardiovascular Rhythm:Regular Rate:Normal     Neuro/Psych    GI/Hepatic   Endo/Other    Renal/GU      Musculoskeletal   Abdominal   Peds  Hematology   Anesthesia Other Findings   Reproductive/Obstetrics                           Anesthesia Physical Anesthesia Plan  ASA: I  Anesthesia Plan: General   Post-op Pain Management:    Induction: Intravenous  Airway Management Planned: Oral ETT  Additional Equipment:   Intra-op Plan:   Post-operative Plan: Extubation in OR  Informed Consent: I have reviewed the patients History and Physical, chart, labs and discussed the procedure including the risks, benefits and alternatives for the proposed anesthesia with the patient or authorized representative who has indicated his/her understanding and acceptance.     Plan Discussed with: CRNA and Surgeon  Anesthesia Plan Comments:         Anesthesia Quick Evaluation

## 2012-09-24 NOTE — Op Note (Signed)
NAME:  EVERITT, WENNER            ACCOUNT NO.:  [id number]  MEDICAL RECORD NO.:  [id number]  LOCATION:                               FACILITY:  [facility]  PHYSICIAN:  Katy Fitch. Bennie Scaff, M.D. DATE OF BIRTH:  [birth year]  DATE OF PROCEDURE:  09/23/2012 DATE OF DISCHARGE:  09/23/2012                              OPERATIVE REPORT   PREOPERATIVE DIAGNOSIS:  Stage III impingement of right shoulder with MRI documented retracting supraspinatus rotator cuff tear, unfavorable type 3 acromial anatomy, arthritis of distal clavicle and synovitis.  POSTOPERATIVE DIAGNOSIS:  Stage III impingement of right shoulder with MRI documented retracting supraspinatus rotator cuff tear, unfavorable type 3 acromial anatomy, arthritis of distal clavicle and synovitis.  OPERATION: 1. Diagnostic arthroscopy, right glenohumeral joint. 2. Arthroscopic debridement of synovitis and minimal PASTA rotator     cuff tear.  3.  Arthroscopic subacromial decompression with     coracoacromial ligament release, leveling the acromion to type 1     morphology and bursectomy. 3. Arthroscopic distal clavicle resection. 4. Arthroscopic repair of a 90% bursal sided supraspinatus retracted     rotator cuff tear with decortication and lowering the profile of     the greater tuberosity.  OPERATING SURGEON:  Katy Fitch. Jaxxon Naeem, MD  ASSISTANT:  Marveen Reeks Dasnoit, PA-C  ANESTHESIA:  General endotracheal supplemented by a right Marcaine plexus block placed by Dr. Bedelia Person in the holding area.  INDICATIONS:  Rance Smithson is a 47 year old gentleman, who is employed at [employer] as a Librarian, academic.  He has had progressive right shoulder pain for the past 3 months.  He was referred in mid August 2014 by Dr. Luanna Cole. Baxley for evaluation and management of his shoulder predicament.  Clinical examination revealed weakness of abduction and external rotation.  Impingement signs and plain films documented new bone growth of the  greater tuberosity, evidencing a probable rotator cuff tear and AC degenerative arthritis with a type 3 acromion.  We advised Mr. Devaul to obtain an MRI of his right shoulder.  This was scheduled at [facility], September 04, 2012.  The MRI revealed a retracting full full-thickness supraspinatus rotator cuff tear.  As Mr. Mohrmann had very unfavorable anatomy of the acromion and arthritis at the [facility] joint, we advised him to proceed with intervention sooner rather than later.  After detailed informed consent in the office, she is brought to the operating room at this time.  We discussed preoperatively the advantage of subacromial decompression, distal clavicle resection followed by rotator cuff repair.  Questions were invited and answered in detail.  Mr. Mucha was interviewed by Dr. Gypsy Balsam in the holding area and after detailed anesthesia and informed consent, selected general endotracheal anesthesia supplemented by a perioperative plexus block for comfort.  In the holding area with ultrasound guidance, Dr. Gypsy Balsam placed a plexus block with Marcaine without complication.  This led to complete anesthesia of the right upper extremity and forequarter.  Mr. Gornick right upper extremity was marked through the proper surgical site identification protocol with a marking pen.  After questions were invited and answered in detail with Mr. Monteverde and his wife, he was brought to room 2 of the [facility]  and placed in supine position on the operating table.  Under Dr. Burnett Corrente direct supervision, general endotracheal anesthesia was induced followed by routine positioning in the beach-chair position with the aid of a torso and head holder designed for shoulder arthroscopy.  The entire left upper extremity and forequarter were prepped with DuraPrep and draped with impervious arthroscopy drapes. The procedure commenced with a routine surgical time-out.  The scope was placed with  an anterior switching stick technique through posterior viewing portal.  Diagnostic arthroscopy revealed abundant synovitis within the glenohumeral joint.  The labrum was intact with minor degenerative changes.  The long head of the biceps had a stable origin at the superior glenoid and was normal through the rotator interval. The subscapularis was normal.  The joint surface of the rotator cuff revealed a small PASTA tear of the supraspinatus measuring perhaps 2-3 mm.  There was no full-thickness tear identified, although there was considerable degeneration of the cuff tissues at the watershed area of the supraspinatus.  The remainder of the joint was inspected.  The hyaline articular cartilage surfaces of the humeral head and glenoid were normal.  The labrum was normal superiorly, posteriorly, and inferiorly and the inferior recess and superior recesses were normal.  The scope was removed from the glenohumeral joint and placed in the subacromial space.  There was florid bursitis noted.  After bursectomy, the coracoacromial ligament was identified and released the cutting cautery.  There was a type 3 acromion that was subsequently leveled with a suction shaver to a type 1 morphology.  The capsule of AC joint was taken down with the cutting cautery and the distal centimeter of clavicle removed arthroscopically.  Hemostasis was achieved with the bipolar cautery.  After bleeding was controlled, we carefully inspected the tear.  There was a 90% bursal surface tear of the supraspinatus that had retracted and there was considerable bony growth on the exposed greater tuberosity.  The suction shaver was used to remove the osteophytes that had formed and create a bleeding bone surface followed by use of a scorpion suture passer to place a fiber tape with a reverse mattress technique followed by 2nd finishing suture FiberWire to anatomically repair the supraspinatus to the decorticated greater  tuberosity.  The sutures were tied under direct vision with a knot pusher and the swivel lock was used to anchor the fiber tape.  A satisfactory repair was achieved with a minimal pucker of the tendon.  Subacromial space inspected and hemostasis confirmed.  The scope was replaced in the joint to confirm that no sutures were following the long head of the biceps or causing other impairment.  Good traction on the supraspinatus was noted from the fiber tape.  The scope was removed from the glenohumeral joint and the portals were irrigated followed by repair with intradermal 3-0 Prolene and Steri- Strips.  Mr. Macpherson was placed in a sling, awakened from general anesthesia and transferred to recovery room with stable signs.  Due to the fact this was an arthroscopic repair, we will offer him the opportunity to return home this afternoon and we will see him in 24 hours in the office for dressing change, application of Tegaderm dressings and instruction in his postoperative exercise program.  Note for aftercare, he has been given prescriptions for Dilaudid 2 mg 1- 2 tablets p.o. q.4-6 hours p.r.n. pain, 30 tablets without refill, also Keflex 500 mg 1 p.o. q.8 hours x4 days as a prophylactic antibiotic. Note also he was provided 2 g of  Ancef as an IV prophylactic antibiotic at the initiation of anesthesia.     Katy Fitch Damarea Merkel, M.D.     RVS/MEDQ  D:  09/23/2012  T:  09/24/2012  Job:  [id number]  cc:   Luanna Cole. Lenord Fellers, M.D.

## 2012-09-25 ENCOUNTER — Encounter (HOSPITAL_BASED_OUTPATIENT_CLINIC_OR_DEPARTMENT_OTHER): Payer: Self-pay | Admitting: Orthopedic Surgery

## 2012-09-28 NOTE — Progress Notes (Signed)
Subjective:    Patient ID: Drew Ochoa, male    DOB: [birth year], 47 y.o.   MRN: [id number]  HPI  47 year old white male scheduled for right rotator cuff repair tomorrow by Dr. Teressa Senter in today for health maintenance exam. He has a history of iron deficiency anemia thought to be secondary to significant rectal bleeding from hemorrhoids. He underwent a thorough GI workup in 2012. Subsequently took iron supplements. He now has a normal hemoglobin. Rectal bleeding has decreased in frequency. He had colonoscopy in October 2012 showing large internal hemorrhoids. He had an upper endoscopy showed antral gastritis. He had 2 small sessile colon polyps and pathology revealed them to be hyperplastic. He also had a capsule endoscopy that proved to be negative. When he was initially evaluated in August 2012, he had an iron level of 28 with total iron-binding capacity of 458. Hemoglobin at that time was 11.8 g with an MCV of 75.3. Total cholesterol was 238 with an LDL cholesterol of 154, HDL cholesterol of 69 and triglycerides of 75.  Social history: He is married and works for [employer]. No children. Wife travels extensively overseas. Occasionally smokes cigars. Social alcohol consumption.  Family history: Grandmother with history of massive stroke at 67 and died at age 80. Maternal grandfather had CABG at age 33 and died at age 65. Paternal grandfather with history of alcoholism but recovered at age 28, had CABG at age 63 and died at age 52. Paternal grandmother living in good health. Mother with history of depression and skin cancer. Father with history of rheumatoid arthritis diagnosed at 2 and also history of colon polyps.    Review of Systems  Constitutional: Negative.   HENT: Negative.   Eyes: Negative.   Cardiovascular: Negative.   Gastrointestinal:       History of rectal bleeding  Endocrine: Negative.   Genitourinary: Negative.   Musculoskeletal:       Right shoulder pain. Bilateral knee  pain.  Allergic/Immunologic: Negative.   Neurological: Negative.   Hematological:       History of iron deficiency anemia secondary to severe rectal bleeding  Psychiatric/Behavioral: Negative.        Objective:   Physical Exam  Vitals reviewed. Constitutional: He is oriented to person, place, and time. He appears well-developed and well-nourished. No distress.  HENT:  Head: Normocephalic and atraumatic.  Right Ear: External ear normal.  Left Ear: External ear normal.  Mouth/Throat: Oropharynx is clear and moist. No oropharyngeal exudate.  Eyes: Conjunctivae and EOM are normal. Pupils are equal, round, and reactive to light. Right eye exhibits no discharge. Left eye exhibits no discharge. No scleral icterus.  Neck: Normal range of motion. Neck supple. No JVD present. No thyromegaly present.  Cardiovascular: Normal rate, regular rhythm, normal heart sounds and intact distal pulses.   No murmur heard. Pulmonary/Chest: Effort normal and breath sounds normal. No respiratory distress. He has no wheezes. He has no rales. He exhibits no tenderness.  Abdominal: Soft. Bowel sounds are normal. He exhibits no distension and no mass. There is no tenderness. There is no rebound and no guarding.  Genitourinary: Prostate normal.  Musculoskeletal: He exhibits no edema.  Decreased range of motion right shoulder  Lymphadenopathy:    He has no cervical adenopathy.  Neurological: He is alert and oriented to person, place, and time. He has normal reflexes. No cranial nerve deficit. Coordination normal.  Skin: Skin is warm and dry. No rash noted. He is not diaphoretic.  Psychiatric: He has a normal  mood and affect. His behavior is normal. Judgment and thought content normal.          Assessment & Plan:  Torn right rotator cuff to be repaired tomorrow by Dr. Teressa Senter  History of bilateral knee pain evaluated by Dr. 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 Return in one year or as needed. Lab work reviewed with the him today is within normal limits. No evidence of anemia

## 2012-11-03 ENCOUNTER — Telehealth: Payer: Self-pay | Admitting: *Deleted

## 2012-11-03 DIAGNOSIS — D509 Iron deficiency anemia, unspecified: Secondary | ICD-10-CM

## 2012-11-03 NOTE — Telephone Encounter (Signed)
Message copied by Florene Glen on Mon Nov 03, 2012  8:37 AM ------      Message from: Florene Glen      Created: Mon Aug 04, 2012  4:22 PM       Repeat cbc and ibc ferritin in 3 months ------

## 2012-11-04 NOTE — Telephone Encounter (Signed)
lmom for pt to call back

## 2012-11-17 NOTE — Telephone Encounter (Signed)
Mailed pt a letter informing him he needs to repeat labs.

## 2012-12-15 ENCOUNTER — Encounter: Payer: Self-pay | Admitting: Internal Medicine

## 2012-12-15 ENCOUNTER — Ambulatory Visit (INDEPENDENT_AMBULATORY_CARE_PROVIDER_SITE_OTHER): Payer: Managed Care, Other (non HMO) | Admitting: Internal Medicine

## 2012-12-15 VITALS — BP 128/88 | HR 72 | Temp 97.8°F | Ht 68.0 in | Wt 191.0 lb

## 2012-12-15 DIAGNOSIS — J069 Acute upper respiratory infection, unspecified: Secondary | ICD-10-CM

## 2012-12-15 DIAGNOSIS — J029 Acute pharyngitis, unspecified: Secondary | ICD-10-CM

## 2012-12-15 MED ORDER — AZITHROMYCIN 250 MG PO TABS: ORAL_TABLET | ORAL | Status: DC

## 2012-12-15 MED ORDER — BENZONATATE 200 MG PO CAPS: 200.0000 mg | ORAL_CAPSULE | Freq: Three times a day (TID) | ORAL | Status: DC | PRN

## 2012-12-15 NOTE — Patient Instructions (Signed)
Take Zithromax Z pak as directed. Take Tessalon perles for cough as needed.

## 2013-04-27 ENCOUNTER — Ambulatory Visit (INDEPENDENT_AMBULATORY_CARE_PROVIDER_SITE_OTHER): Payer: Managed Care, Other (non HMO) | Admitting: Internal Medicine

## 2013-04-27 ENCOUNTER — Encounter: Payer: Self-pay | Admitting: Internal Medicine

## 2013-04-27 VITALS — BP 108/86 | HR 84 | Wt 193.0 lb

## 2013-04-27 DIAGNOSIS — M2390 Unspecified internal derangement of unspecified knee: Secondary | ICD-10-CM

## 2013-04-27 DIAGNOSIS — M25569 Pain in unspecified knee: Secondary | ICD-10-CM

## 2013-04-27 DIAGNOSIS — M25562 Pain in left knee: Secondary | ICD-10-CM

## 2013-04-27 DIAGNOSIS — M2392 Unspecified internal derangement of left knee: Secondary | ICD-10-CM

## 2013-04-27 NOTE — Patient Instructions (Signed)
We will try to get MRI of left knee improved. Take Aleve twice daily.

## 2013-04-27 NOTE — Progress Notes (Signed)
   Subjective:    Patient ID: Drew Ochoa, male    DOB: 02/23/1965, 48 y.o.   MRN: [id number]  HPI In  late 2014, he had shoulder reconstruction surgery by Dr. Daylene Katayama. Also, in July 2014 he was having knee issues. Was felt to have osteoarthritis of the knees. He was seen by Dr. Mardelle Matte who thought he possibly might need injections but was reluctant to consider surgery then. He now has more pain in his left knee and it has been locking up on him at times. He does better riding a bicycle than walking. No significant swelling just pain particularly in the lateral joint line area. The locking up is also frightening to him because he feels he might fall. Right knee also hurts but not to the degree of the left knee.    Review of Systems     Objective:   Physical Exam  No knee effusion on the left. He has joint line tenderness left lateral knee. Crepitus with flexion. Crepitus with flexion of right knee. No effusion of right knee and good range of motion.      Assessment & Plan:  ? Loose body left knee with history of it locking up and pain  Plan: MRI of left knee. May need to see Dr. Mardelle Matte again. Take Aleve twice daily for pain

## 2013-04-29 NOTE — Addendum Note (Signed)
Addended by: Brett Canales on: 04/29/2013 12:14 PM   Modules accepted: Orders

## 2013-05-03 ENCOUNTER — Other Ambulatory Visit: Payer: Managed Care, Other (non HMO)

## 2013-05-04 ENCOUNTER — Ambulatory Visit
Admission: RE | Admit: 2013-05-04 | Discharge: 2013-05-04 | Disposition: A | Discharge status: Home / Self Care | Payer: Managed Care, Other (non HMO) | Source: Ambulatory Visit | Attending: Internal Medicine | Admitting: Internal Medicine

## 2013-05-04 ENCOUNTER — Telehealth: Payer: Self-pay | Admitting: Internal Medicine

## 2013-05-04 DIAGNOSIS — M25562 Pain in left knee: Secondary | ICD-10-CM

## 2013-05-04 DIAGNOSIS — M2392 Unspecified internal derangement of left knee: Secondary | ICD-10-CM

## 2013-05-04 NOTE — Telephone Encounter (Signed)
Called pt to give him results of MRI. Knee continues to lock up on him. I think he should go back to Dr. Mardelle Matte for further evaluation.

## 2013-05-05 ENCOUNTER — Other Ambulatory Visit: Payer: Managed Care, Other (non HMO)

## 2013-05-06 ENCOUNTER — Encounter: Payer: Self-pay | Admitting: Internal Medicine

## 2013-05-06 ENCOUNTER — Other Ambulatory Visit: Payer: Managed Care, Other (non HMO)

## 2013-05-06 NOTE — Progress Notes (Signed)
   Subjective:    Patient ID: Drew Ochoa, male    DOB: [birth year], 48 y.o.   MRN: [id number]  HPI  URI symptoms with cough and congestion. No documented fever or shaking chills. No nausea. No significant myalgias.    Review of Systems     Objective:   Physical Exam skin is warm and dry. Pharynx slightly injected. TMs are clear. Neck supple. Chest clear. Rapid strep screen is negative.        Assessment & Plan:  Acute URI  Pharyngitis  Plan: Zithromax Z-Pak take 2 tablets day one followed by 1 tablet days 2 through 5. Tessalon Perles take 200 mg 3 times daily as needed for cough. Call if not better in 7-10 days or sooner if worse.

## 2013-09-24 ENCOUNTER — Other Ambulatory Visit: Payer: Managed Care, Other (non HMO) | Admitting: Internal Medicine

## 2013-09-24 DIAGNOSIS — Z0000 Encounter for general adult medical examination without abnormal findings: Secondary | ICD-10-CM

## 2013-09-24 DIAGNOSIS — E785 Hyperlipidemia, unspecified: Secondary | ICD-10-CM

## 2013-09-24 DIAGNOSIS — D509 Iron deficiency anemia, unspecified: Secondary | ICD-10-CM

## 2013-09-24 LAB — LIPID PANEL
CHOL/HDL RATIO: 3 ratio
Cholesterol: 189 mg/dL (ref 0–200)
HDL: 64 mg/dL (ref >39)
LDL Cholesterol: 114 mg/dL — ABNORMAL HIGH (ref 0–99)
Triglycerides: 55 mg/dL (ref <150)
VLDL: 11 mg/dL (ref 0–40)

## 2013-09-24 LAB — CBC WITH DIFFERENTIAL/PLATELET
BASOS ABS: 0.1 10*3/uL (ref 0.0–0.1)
Basophils Relative: 1 % (ref 0–1)
Eosinophils Absolute: 0.1 10*3/uL (ref 0.0–0.7)
Eosinophils Relative: 2 % (ref 0–5)
HCT: 45.3 % (ref 39.0–52.0)
Hemoglobin: 15.8 g/dL (ref 13.0–17.0)
LYMPHS ABS: 1.7 10*3/uL (ref 0.7–4.0)
LYMPHS PCT: 30 % (ref 12–46)
MCH: 30 pg (ref 26.0–34.0)
MCHC: 34.9 g/dL (ref 30.0–36.0)
MCV: 86 fL (ref 78.0–100.0)
Monocytes Absolute: 0.5 10*3/uL (ref 0.1–1.0)
Monocytes Relative: 8 % (ref 3–12)
NEUTROS ABS: 3.4 10*3/uL (ref 1.7–7.7)
NEUTROS PCT: 59 % (ref 43–77)
PLATELETS: 257 10*3/uL (ref 150–400)
RBC: 5.27 MIL/uL (ref 4.22–5.81)
RDW: 14 % (ref 11.5–15.5)
WBC: 5.7 10*3/uL (ref 4.0–10.5)

## 2013-09-24 LAB — COMPREHENSIVE METABOLIC PANEL
ALBUMIN: 4 g/dL (ref 3.5–5.2)
ALT: 22 U/L (ref 0–53)
AST: 17 U/L (ref 0–37)
Alkaline Phosphatase: 54 U/L (ref 39–117)
BUN: 12 mg/dL (ref 6–23)
CALCIUM: 9.3 mg/dL (ref 8.4–10.5)
CHLORIDE: 101 meq/L (ref 96–112)
CO2: 29 mEq/L (ref 19–32)
CREATININE: 1.09 mg/dL (ref 0.50–1.35)
Glucose, Bld: 85 mg/dL (ref 70–99)
POTASSIUM: 4.5 meq/L (ref 3.5–5.3)
SODIUM: 137 meq/L (ref 135–145)
TOTAL PROTEIN: 6.3 g/dL (ref 6.0–8.3)
Total Bilirubin: 0.9 mg/dL (ref 0.2–1.2)

## 2013-09-25 ENCOUNTER — Ambulatory Visit (INDEPENDENT_AMBULATORY_CARE_PROVIDER_SITE_OTHER): Payer: Managed Care, Other (non HMO) | Admitting: Internal Medicine

## 2013-09-25 ENCOUNTER — Encounter: Payer: Self-pay | Admitting: Internal Medicine

## 2013-09-25 VITALS — BP 110/78 | HR 80 | Ht 68.0 in | Wt 193.0 lb

## 2013-09-25 DIAGNOSIS — Z0000 Encounter for general adult medical examination without abnormal findings: Secondary | ICD-10-CM

## 2013-09-25 LAB — POCT URINALYSIS DIPSTICK
Bilirubin, UA: NEGATIVE
Blood, UA: NEGATIVE
GLUCOSE UA: NEGATIVE
KETONES UA: NEGATIVE
LEUKOCYTES UA: NEGATIVE
Nitrite, UA: NEGATIVE
PROTEIN UA: NEGATIVE
Spec Grav, UA: <=1.005
Urobilinogen, UA: NEGATIVE
pH, UA: 6.5

## 2013-10-07 NOTE — Patient Instructions (Signed)
Return in one year or as needed. 

## 2013-10-07 NOTE — Progress Notes (Signed)
   Subjective:    Patient ID: Drew Ochoa, male    DOB: [birth year], 48 y.o.   MRN: [id number]  HPI 48 year old white male in today for health maintenance exam. Had rotator cuff repair right shoulder by Dr. Stephens Shire for in 2014. History of iron deficiency anemia thought to be secondary to significant rectal bleeding from hemorrhoids. He underwent a thorough GI workup in 2012. He subsequently took iron supplements. Now has normal hemoglobin. Seldom has rectal bleeding. He had a colonoscopy in October 2012 showing large internal hemorrhoids. He had an upper endoscopy showing antral gastritis. He had 2 small sessile colon polyps and Pap biology revealed it to be hyperplastic. He also had a capsule endoscopy that proved to be negative. When he was initially evaluated in August 2012 he had an iron level of 28 with a total iron-binding capacity of 458. Hemoglobin at that time was 11.8 g with an MCV of 75.3. Total cholesterol was 238 with an LDL cholesterol of 154. HDL cholesterol 69. Triglycerides 75.  Social history: He is married and works for [employer]. No children. Wife travels extensively overseas. Social alcohol consumption. Exercise as a great deal including biking. Occasionally smokes cigars.  Family history: Grandmother with history of massive stroke at age 98 and died at age 51. Maternal grandfather had CABG at age 57 and died at age 2. Paternal grandfather with history of alcoholism but recovered at age 5, had CABG at age 66 and died at age 71. Paternal grandmother living in good health. Mother with history of depression and skin cancer. Father with history of rheumatoid arthritis diagnosed at age 50 and also history of colon polyps.    Review of Systems  Constitutional: Negative.  Negative for fever, activity change and fatigue.  All other systems reviewed and are negative.      Objective:   Physical Exam  Vitals reviewed. Constitutional: He is oriented to person, place, and time. He  appears well-developed and well-nourished. No distress.  HENT:  Head: Normocephalic and atraumatic.  Left Ear: External ear normal.  Mouth/Throat: Oropharynx is clear and moist. No oropharyngeal exudate.  Eyes: Conjunctivae and EOM are normal. Right eye exhibits no discharge. Left eye exhibits no discharge.  Neck: Neck supple.  Cardiovascular: Normal rate, regular rhythm, normal heart sounds and intact distal pulses.   No murmur heard. Pulmonary/Chest: Effort normal and breath sounds normal. No respiratory distress. He has no wheezes. He has no rales. He exhibits no tenderness.  Abdominal: Soft. Bowel sounds are normal. He exhibits no distension and no mass. There is no tenderness. There is no rebound and no guarding.  Genitourinary:  Hernias to direct palpation. Prostate normal.  Musculoskeletal: He exhibits no edema.  Neurological: He is alert and oriented to person, place, and time. He has normal reflexes. He displays normal reflexes. No cranial nerve deficit. Coordination normal.  Skin: Skin is warm and dry. No rash noted. He is not diaphoretic.  Psychiatric: He has a normal mood and affect. His behavior is normal. Thought content normal.          Assessment & Plan:  History of large internal hemorrhoids causing rectal bleeding and anemia  Anemia resolved  History of right rotator cuff repair  History of bilateral knee pain evaluated by Dr. Mardelle Matte and felt to have chondromalacia patella  History of hyperlipidemia LDL improved from 139 2 years ago to 114 now.  Plan: Return in one year or as needed.

## 2014-02-11 ENCOUNTER — Telehealth: Payer: Self-pay | Admitting: Internal Medicine

## 2014-02-11 NOTE — Telephone Encounter (Signed)
Pt would like to speak to Dr. Renold Genta re: her confidence level in the recommended knee replacement by Dr. Mardelle Matte.  Best number to call pt is work #[phone]/ lt

## 2014-02-11 NOTE — Telephone Encounter (Signed)
Please schedule appt to discuss

## 2014-02-16 ENCOUNTER — Ambulatory Visit (INDEPENDENT_AMBULATORY_CARE_PROVIDER_SITE_OTHER): Payer: Managed Care, Other (non HMO) | Admitting: Internal Medicine

## 2014-02-16 ENCOUNTER — Encounter: Payer: Self-pay | Admitting: Internal Medicine

## 2014-02-16 VITALS — BP 106/78 | HR 71 | Temp 97.6°F

## 2014-02-16 DIAGNOSIS — M1711 Unilateral primary osteoarthritis, right knee: Secondary | ICD-10-CM

## 2014-02-17 NOTE — Progress Notes (Addendum)
   Subjective:    Patient ID: Drew Ochoa, male    DOB: 1965/02/08, 49 y.o.   MRN: [id number]  HPI  Patient called earlier asking for my opinion regarding partial right knee replacement which has been recommended by orthopedist. He brings an MRI report which was reviewed with him today. He had a previous knee arthroscopy in 2006. Having considerable amount of pain which is mostly aching and occasionally knee will lock and stick. He's tried injections. He's tried a brace. He can't do the things he likes to do such as hiking without discomfort. MRI showed medial degenerative changes and what amount of meniscus is left may have a small tear.  He likes Dr. Mardelle Matte and had  a good talk with him recently. He answered all of his questions.    Review of Systems     Objective:   Physical Exam  Not examined. Spent 20 minutes speaking with him about his concerns and issues with partial knee replacement. He is comfortable with orthopedist. He has tentatively schedule the procedure      Assessment & Plan:  Right knee osteoarthritis  Probable tear medial meniscus right knee  Plan: Patient indicates he will proceed with partial right knee replacement in the near future.

## 2014-03-06 NOTE — Patient Instructions (Signed)
Patient to consider knee replacement therapy in the near future.

## 2014-05-10 ENCOUNTER — Other Ambulatory Visit: Payer: Managed Care, Other (non HMO) | Admitting: Internal Medicine

## 2014-09-27 ENCOUNTER — Other Ambulatory Visit: Payer: Managed Care, Other (non HMO) | Admitting: Internal Medicine

## 2014-09-27 ENCOUNTER — Other Ambulatory Visit: Payer: Self-pay | Admitting: Internal Medicine

## 2014-09-27 DIAGNOSIS — Z130 Encounter for screening for diseases of the blood and blood-forming organs and certain disorders involving the immune mechanism: Secondary | ICD-10-CM

## 2014-09-27 DIAGNOSIS — Z13220 Encounter for screening for lipoid disorders: Secondary | ICD-10-CM

## 2014-09-27 DIAGNOSIS — E785 Hyperlipidemia, unspecified: Secondary | ICD-10-CM

## 2014-09-27 DIAGNOSIS — Z862 Personal history of diseases of the blood and blood-forming organs and certain disorders involving the immune mechanism: Secondary | ICD-10-CM

## 2014-09-27 DIAGNOSIS — Z0000 Encounter for general adult medical examination without abnormal findings: Secondary | ICD-10-CM

## 2014-09-27 LAB — CBC WITH DIFFERENTIAL/PLATELET
BASOS ABS: 0.1 10*3/uL (ref 0.0–0.1)
Basophils Relative: 1 % (ref 0–1)
EOS PCT: 2 % (ref 0–5)
Eosinophils Absolute: 0.1 10*3/uL (ref 0.0–0.7)
HEMATOCRIT: 43.5 % (ref 39.0–52.0)
Hemoglobin: 14.6 g/dL (ref 13.0–17.0)
LYMPHS ABS: 1.9 10*3/uL (ref 0.7–4.0)
LYMPHS PCT: 34 % (ref 12–46)
MCH: 29.2 pg (ref 26.0–34.0)
MCHC: 33.6 g/dL (ref 30.0–36.0)
MCV: 87 fL (ref 78.0–100.0)
MONO ABS: 0.5 10*3/uL (ref 0.1–1.0)
MPV: 9 fL (ref 8.6–12.4)
Monocytes Relative: 8 % (ref 3–12)
Neutro Abs: 3.1 10*3/uL (ref 1.7–7.7)
Neutrophils Relative %: 55 % (ref 43–77)
Platelets: 288 10*3/uL (ref 150–400)
RBC: 5 MIL/uL (ref 4.22–5.81)
RDW: 13.9 % (ref 11.5–15.5)
WBC: 5.7 10*3/uL (ref 4.0–10.5)

## 2014-09-27 LAB — LIPID PANEL
CHOL/HDL RATIO: 3.2 ratio (ref <=5.0)
Cholesterol: 215 mg/dL — ABNORMAL HIGH (ref 125–200)
HDL: 68 mg/dL (ref >=40)
LDL Cholesterol: 136 mg/dL — ABNORMAL HIGH (ref <130)
TRIGLYCERIDES: 57 mg/dL (ref <150)
VLDL: 11 mg/dL (ref <30)

## 2014-09-27 LAB — COMPREHENSIVE METABOLIC PANEL
ALK PHOS: 47 U/L (ref 40–115)
ALT: 11 U/L (ref 9–46)
AST: 17 U/L (ref 10–40)
Albumin: 3.8 g/dL (ref 3.6–5.1)
BUN: 21 mg/dL (ref 7–25)
CALCIUM: 9.1 mg/dL (ref 8.6–10.3)
CO2: 27 mmol/L (ref 20–31)
Chloride: 105 mmol/L (ref 98–110)
Creat: 1.15 mg/dL (ref 0.60–1.35)
GLUCOSE: 81 mg/dL (ref 65–99)
POTASSIUM: 4.6 mmol/L (ref 3.5–5.3)
Sodium: 137 mmol/L (ref 135–146)
Total Bilirubin: 0.8 mg/dL (ref 0.2–1.2)
Total Protein: 6.2 g/dL (ref 6.1–8.1)

## 2014-09-28 ENCOUNTER — Encounter: Payer: Managed Care, Other (non HMO) | Admitting: Internal Medicine

## 2014-10-01 ENCOUNTER — Encounter: Payer: Self-pay | Admitting: Internal Medicine

## 2014-10-01 ENCOUNTER — Ambulatory Visit (INDEPENDENT_AMBULATORY_CARE_PROVIDER_SITE_OTHER): Payer: Managed Care, Other (non HMO) | Admitting: Internal Medicine

## 2014-10-01 VITALS — BP 124/80 | HR 85 | Temp 98.0°F | Ht 68.0 in | Wt 192.0 lb

## 2014-10-01 DIAGNOSIS — R0683 Snoring: Secondary | ICD-10-CM | POA: Diagnosis not present

## 2014-10-01 DIAGNOSIS — Z0000 Encounter for general adult medical examination without abnormal findings: Secondary | ICD-10-CM

## 2014-10-01 LAB — POCT URINALYSIS DIPSTICK
Bilirubin, UA: NEGATIVE
Glucose, UA: NEGATIVE
Ketones, UA: NEGATIVE
LEUKOCYTES UA: NEGATIVE
NITRITE UA: NEGATIVE
PH UA: 6
Protein, UA: NEGATIVE
RBC UA: NEGATIVE
Spec Grav, UA: 1.02
UROBILINOGEN UA: NEGATIVE

## 2014-10-01 NOTE — Progress Notes (Signed)
Subjective:    Patient ID: Drew Ochoa, male    DOB: 1965-07-01, 49 y.o.   MRN: [id number]  HPI  49 year old White Male in today for health maintenance exam and evaluation of medical issues. Says he has a history of snoring and would like to be evaluated for sleep apnea. This was mentioned to him by staff at [facility] when he had partial knee replacement several months ago.   He had a right rotator cuff repair by Dr. Daylene Katayama in 2014.    History of iron deficiency anemia thought to be secondary to significant rectal bleeding from hemorrhoids. He went through a thorough GI workup in 2012. He subsequently took iron supplements. He now has a normal hemoglobin. Seldom has rectal bleeding. Had colonoscopy October 2012 showing large internal hemorrhoids. He had an upper endoscopy showing antral gastritis. He had 2 small sessile colon polyps and pathology revealed them to be hyperplastic. He had a negative Colonoscopy. When he was Initially evaluated in August 2012, he had an Iron Level of 28 with a Total Iron Binding Capacity of 458. Hemoglobin at That Time Was 11.8 G with an MCV of 75.3. Also at That Time Total Cholesterol Was 238 with an LDL Cholesterol of 154. HDL Cholesterol Was 69 and Triglycerides Were 75. In January 2014 he had a De- arterialization of internal hemorrhoids by Dr. Leighton Ruff.  He is very physically active but hasn't been quite as active over the past few months due to recovering from his partial right knee replacement.  Social history: He is married and works for [employer].  No children. His wife travels extensively overseas. Social alcohol consumption. Occasionally smokes cigars.  Family history: Grandmother with massive stroke at age 82 and died at 52. Maternal grandfather had CABG at age 52 and died at age 86. Paternal grandfather with history of alcoholism but recovered at age 83, had CABG at age 58 and died at age 61. Paternal grandmother with good health. Mother with  history of depression and skin cancer. Father with history of rheumatoid arthritis diagnosed at age 12 and also history of colon polyps.    Review of Systems  Constitutional: Negative.   All other systems reviewed and are negative.      Objective:   Physical Exam  Constitutional: He is oriented to person, place, and time. He appears well-developed and well-nourished. No distress.  HENT:  Head: Normocephalic and atraumatic.  Right Ear: External ear normal.  Left Ear: External ear normal.  Mouth/Throat: Oropharynx is clear and moist. No oropharyngeal exudate.  Eyes: Conjunctivae and EOM are normal. Pupils are equal, round, and reactive to light. Right eye exhibits no discharge. Left eye exhibits no discharge. No scleral icterus.  Neck: Neck supple. No JVD present. No thyromegaly present.  Cardiovascular: Normal rate, regular rhythm, normal heart sounds and intact distal pulses.   No murmur heard. Pulmonary/Chest: Effort normal and breath sounds normal. No respiratory distress. He has no wheezes. He has no rales.  Abdominal: Soft. Bowel sounds are normal. He exhibits no distension and no mass. There is no tenderness. There is no rebound and no guarding.  Genitourinary: Prostate normal.  Musculoskeletal: He exhibits no edema.  Good range of motion with right knee  Lymphadenopathy:    He has no cervical adenopathy.  Neurological: He is alert and oriented to person, place, and time. He has normal reflexes. No cranial nerve deficit. Coordination normal.  Skin: Skin is warm and dry. No rash noted. He is not diaphoretic.  Psychiatric: He has a normal mood and affect. His behavior is normal. Judgment and thought content normal.  Vitals reviewed.         Assessment & Plan:  Mild elevation of LDL cholesterol-likely due to not as much exercise over the past 6 months status post partial right knee replacement. LDL cholesterol has increased from 114 to 136. Total cholesterol has increased  from 189 to 215.  Status post partial right knee replacement  History of right rotator cuff repair  History of large internal hemorrhoids status post De- arterialization in 2014  History of iron deficiency anemia due to large internal hemorrhoids  History of snoring-evaluate for sleep apnea  Plan: Patient is to watch diet and try to gradually increase his exercise over the next 6 months. Return in one year or as needed. Will have pulmonary evaluation of possible sleep apnea.

## 2014-10-01 NOTE — Patient Instructions (Signed)
Pulmonary evaluation for possible sleep apnea due to history of snoring. Watch diet and continue exercise. Return in one year or as needed. It was a pleasure to see you today.

## 2014-10-02 LAB — PSA: PSA: 1.21 ng/mL (ref <=4.00)

## 2014-10-04 ENCOUNTER — Telehealth: Payer: Self-pay | Admitting: *Deleted

## 2014-10-04 NOTE — Telephone Encounter (Signed)
Left message for patient to call back to review PSA results

## 2014-10-07 ENCOUNTER — Encounter: Payer: Self-pay | Admitting: Pulmonary Disease

## 2014-10-07 ENCOUNTER — Ambulatory Visit (INDEPENDENT_AMBULATORY_CARE_PROVIDER_SITE_OTHER): Payer: Managed Care, Other (non HMO) | Admitting: Pulmonary Disease

## 2014-10-07 VITALS — BP 120/80 | HR 87 | Temp 98.4°F | Ht 68.0 in | Wt 191.8 lb

## 2014-10-07 DIAGNOSIS — G4733 Obstructive sleep apnea (adult) (pediatric): Secondary | ICD-10-CM | POA: Insufficient documentation

## 2014-10-07 NOTE — Progress Notes (Signed)
Chief Complaint  Patient presents with  . SLEEP CONSULT    pt referred by Dr. Renold Genta. pt states wife and nurses have told him he stops breahing whjile he is sleeping, and he is snoring. pt states he wakes up every 2 or 3 hours a night. pt sttaes he wakes up restless. Epworth Score: 12    History of Present Illness: Drew Ochoa is a 49 y.o. male for evaluation of sleep problems.  He had knee surgery recently.  He was told by the hospital staff that he should get assessed for sleep apnea.  His wife has noticed that he snores, and will stop breathing while asleep.  This seems to happen more on his back.  He remembers his dreams frequently.  He will wake up hearing himself snore, and feeling sweaty.  He will talk in his sleep.  He goes to sleep at 11 pm.  He falls asleep after 5 minutes.  He wakes up several times to shift position in bed.  He gets out of bed at 6 am.  He feels tired in the morning.  He denies morning headache.  He does not use anything to help him fall asleep.  He will drink a few cups of coffee in the morning at work.  He can start to get sleep when working on computer or watching TV.  He denies sleep talking, bruxism, or nightmares.  There is no history of restless legs.  He denies sleep hallucinations, sleep paralysis, or cataplexy.  The Epworth score is 12 out of 24.   Drew Ochoa  has a past medical history of Iron deficiency anemia; Hyperlipidemia; Hemorrhoids; Weakness; Blood in stool; Bronchitis; Colon polyps; Complication of anesthesia; and Complication of anesthesia.  Drew Ochoa  has past surgical history that includes Knee surgery (2006); Nose surgery; Colonoscopy; Esophagogastroduodenoscopy endoscopy; Transanal hemorrhoidal dearterialization (02/01/2012); Back surgery (2008); Eye surgery; and Shoulder arthroscopy with rotator cuff repair and subacromial decompression (Right, 09/23/2012).  Prior to Admission medications   Not on File    No Known  Allergies  His family history includes Alzheimer's disease in his maternal uncle; Arthritis in his father; Colon polyps in his father; Depression in his father; Diabetes in his maternal uncle; Mental illness in his mother; Skin cancer in his mother.  He  reports that he has never smoked. He has never used smokeless tobacco. He reports that he drinks alcohol. He reports that he does not use illicit drugs.  Review of Systems  Constitutional: Negative for fever and unexpected weight change.  HENT: Negative for congestion, dental problem, ear pain, nosebleeds, postnasal drip, rhinorrhea, sinus pressure, sneezing, sore throat and trouble swallowing.   Eyes: Negative for redness and itching.  Respiratory: Negative for cough, chest tightness, shortness of breath and wheezing.   Cardiovascular: Negative for palpitations and leg swelling.  Gastrointestinal: Negative for nausea and vomiting.  Genitourinary: Negative for dysuria.  Musculoskeletal: Negative for joint swelling.  Skin: Negative for rash.  Neurological: Negative for headaches.  Hematological: Does not bruise/bleed easily.  Psychiatric/Behavioral: Negative for dysphoric mood. The patient is not nervous/anxious.    Physical Exam: BP 120/80 mmHg  Pulse 87  Temp(Src) 98.4 F (36.9 C) (Oral)  Ht 5\' 8"  (1.727 m)  Wt 191 lb 12.8 oz (87 kg)  BMI 29.17 kg/m2  SpO2 97%  General - No distress ENT - No sinus tenderness, no oral exudate, no LAN, no thyromegaly, TM clear, pupils equal/reactive, MP 4, enlarged tongue, low laying soft palate Cardiac - s1s2  regular, no murmur, pulses symmetric Chest - No wheeze/rales/dullness, good air entry, normal respiratory excursion Back - No focal tenderness Abd - Soft, non-tender, no organomegaly, + bowel sounds Ext - No edema Neuro - Normal strength, cranial nerves intact Skin - No rashes Psych - Normal mood, and behavior  Discussion: He has snoring, sleep disruption, witnessed apnea and daytime  sleepiness.  I am concerned he could have sleep apnea.  We discussed how sleep apnea can affect various health problems including risks for hypertension, cardiovascular disease, and diabetes.  We also discussed how sleep disruption can increase risks for accident, such as while driving.  Weight loss as a means of improving sleep apnea was also reviewed.  Additional treatment options discussed were CPAP therapy, oral appliance, and surgical intervention.  Assessment/plan:  Obstructive sleep apnea. Plan: - will arrange for home sleep study, pending insurance approval - he might a good candidate for oral appliance if he is found to have sleep apnea    Chesley Mires, M.D. Pager [phone]

## 2014-10-07 NOTE — Progress Notes (Deleted)
   Subjective:    Patient ID: Drew Ochoa, male    DOB: 10-08-1965, 49 y.o.   MRN: [id number]  HPI    Review of Systems  Constitutional: Negative for fever and unexpected weight change.  HENT: Negative for congestion, dental problem, ear pain, nosebleeds, postnasal drip, rhinorrhea, sinus pressure, sneezing, sore throat and trouble swallowing.   Eyes: Negative for redness and itching.  Respiratory: Negative for cough, chest tightness, shortness of breath and wheezing.   Cardiovascular: Negative for palpitations and leg swelling.  Gastrointestinal: Negative for nausea and vomiting.  Genitourinary: Negative for dysuria.  Musculoskeletal: Negative for joint swelling.  Skin: Negative for rash.  Neurological: Negative for headaches.  Hematological: Does not bruise/bleed easily.  Psychiatric/Behavioral: Negative for dysphoric mood. The patient is not nervous/anxious.        Objective:   Physical Exam        Assessment & Plan:

## 2014-10-07 NOTE — Patient Instructions (Signed)
Will arrange for home sleep study Will call to arrange for follow up after sleep study reviewed  

## 2014-10-08 NOTE — Telephone Encounter (Signed)
Left message for patient to return call to review PSA results

## 2014-10-12 NOTE — Telephone Encounter (Signed)
Left message for patient to return call to review PSA results

## 2014-10-13 NOTE — Telephone Encounter (Signed)
Reviewed PSA results with patient 

## 2014-10-21 DIAGNOSIS — G4733 Obstructive sleep apnea (adult) (pediatric): Secondary | ICD-10-CM | POA: Diagnosis not present

## 2014-10-22 ENCOUNTER — Telehealth: Payer: Self-pay | Admitting: Pulmonary Disease

## 2014-10-22 NOTE — Telephone Encounter (Signed)
HST 10/21/14 >> AHI 13.9, SaO2 low 83%.  Will have my nurse inform pt that sleep study shows mild to moderate sleep apnea.  He needs ROV to discuss tx options >> can be with me or Tammy Parrett.

## 2014-10-25 NOTE — Telephone Encounter (Signed)
lmtcb for pt.  

## 2014-10-26 NOTE — Telephone Encounter (Signed)
[phone], pt cb

## 2014-10-26 NOTE — Telephone Encounter (Signed)
Called spoke with pt. appt scheduled to see TP tomorrow AM for review of results. Nothing further needed

## 2014-10-27 ENCOUNTER — Ambulatory Visit (INDEPENDENT_AMBULATORY_CARE_PROVIDER_SITE_OTHER): Payer: Managed Care, Other (non HMO) | Admitting: Adult Health

## 2014-10-27 ENCOUNTER — Encounter: Payer: Self-pay | Admitting: Adult Health

## 2014-10-27 VITALS — BP 122/82 | HR 84 | Temp 98.2°F | Ht 68.0 in | Wt 192.0 lb

## 2014-10-27 DIAGNOSIS — G4733 Obstructive sleep apnea (adult) (pediatric): Secondary | ICD-10-CM | POA: Diagnosis not present

## 2014-10-27 NOTE — Progress Notes (Signed)
Reviewed and agree with assessment/plan. 

## 2014-10-27 NOTE — Assessment & Plan Note (Signed)
Mild to moderate OSA   Plan  Begin CPAP At bedtime  .  Goal is to wear at least 4-6 hr each night.  Do not drive if sleepy.  Download in 1 month follow up Dr. Halford Chessman  Or Parrett in 2 months  Work on exercise and  weight loss.

## 2014-10-27 NOTE — Progress Notes (Signed)
   Subjective:    Patient ID: Drew Ochoa, male    DOB: August 31, 1965, 49 y.o.   MRN: [id number]  HPI 49 yo male seen for sleep consult 10/07/14 for snoring, daytime sleepiness and witnessed apnea after knee surgery .   TEST  HST 10/21/14 >AHI 13.9, SaO2 low 83%.   10/27/2014 Follow up : OSA  Pt returns for 3 week follow up . Seen last ov for sleep consult with daytime sleepiness, snoring . He was set up for HST on 10/13 with AHI at 13.9. We discussed his results .  Went over treatment options with wt loss, oral appliance and CPAP . He opted to proceed with CPAP .  He denies chest pain, orthopnea, edema or fever.     Review of Systems Constitutional:   No  weight loss, night sweats,  Fevers, chills,  +fatigue, or  Lassitude.+ daytime sleepiness  HEENT:   No headaches,  Difficulty swallowing,  Tooth/dental problems, or  Sore throat,                No sneezing, itching, ear ache, nasal congestion, post nasal drip,   CV:  No chest pain,  Orthopnea, PND, swelling in lower extremities, anasarca, dizziness, palpitations, syncope.   GI  No heartburn, indigestion, abdominal pain, nausea, vomiting, diarrhea, change in bowel habits, loss of appetite, bloody stools.   Resp: No shortness of breath with exertion or at rest.  No excess mucus, no productive cough,  No non-productive cough,  No coughing up of blood.  No change in color of mucus.  No wheezing.  No chest wall deformity  Skin: no rash or lesions.  GU: no dysuria, change in color of urine, no urgency or frequency.  No flank pain, no hematuria   MS:  No joint pain or swelling.  No decreased range of motion.  No back pain.  Psych:  No change in mood or affect. No depression or anxiety.  No memory loss.         Objective:   Physical Exam  GEN: A/Ox3; pleasant , NAD, well nourished   HEENT:  [place]/AT,  EACs-clear, TMs-wnl, NOSE-clear, THROAT-clear, no lesions, no postnasal drip or exudate noted. Class 2 MP airway   NECK:   Supple w/ fair ROM; no JVD; normal carotid impulses w/o bruits; no thyromegaly or nodules palpated; no lymphadenopathy.  RESP  Clear  P & A; w/o, wheezes/ rales/ or rhonchi.no accessory muscle use, no dullness to percussion  CARD:  RRR, no m/r/g  , no peripheral edema, pulses intact, no cyanosis or clubbing.  GI:   Soft & nt; nml bowel sounds; no organomegaly or masses detected.  Musco: Warm bil, no deformities or joint swelling noted.   Neuro: alert, no focal deficits noted.    Skin: Warm, no lesions or rashes        Assessment & Plan:

## 2014-10-27 NOTE — Patient Instructions (Signed)
Begin CPAP At bedtime  .  Goal is to wear at least 4-6 hr each night.  Do not drive if sleepy.  Download in 1 month follow up Dr. Halford Chessman  Or Vamsi Apfel in 2 months  Work on exercise and  weight loss.

## 2014-10-28 DIAGNOSIS — G4733 Obstructive sleep apnea (adult) (pediatric): Secondary | ICD-10-CM | POA: Diagnosis not present

## 2014-10-29 ENCOUNTER — Other Ambulatory Visit: Payer: Self-pay | Admitting: *Deleted

## 2014-10-29 DIAGNOSIS — G4733 Obstructive sleep apnea (adult) (pediatric): Secondary | ICD-10-CM

## 2014-11-02 ENCOUNTER — Telehealth: Payer: Self-pay | Admitting: Pulmonary Disease

## 2014-11-02 DIAGNOSIS — G4733 Obstructive sleep apnea (adult) (pediatric): Secondary | ICD-10-CM

## 2014-11-02 NOTE — Telephone Encounter (Signed)
Spoke with pt. States that [place] does not participate with [employer]. He will need a new DME. Order will be placed for this. Nothing further was needed.

## 2015-01-25 ENCOUNTER — Ambulatory Visit: Payer: Managed Care, Other (non HMO) | Admitting: Pulmonary Disease

## 2015-02-07 ENCOUNTER — Encounter: Payer: Self-pay | Admitting: Pulmonary Disease

## 2015-02-07 ENCOUNTER — Ambulatory Visit (INDEPENDENT_AMBULATORY_CARE_PROVIDER_SITE_OTHER): Payer: Managed Care, Other (non HMO) | Admitting: Pulmonary Disease

## 2015-02-07 VITALS — BP 112/80 | HR 87 | Ht 68.0 in | Wt 193.8 lb

## 2015-02-07 DIAGNOSIS — G4733 Obstructive sleep apnea (adult) (pediatric): Secondary | ICD-10-CM

## 2015-02-07 NOTE — Patient Instructions (Signed)
Follow up in 1 year.

## 2015-02-07 NOTE — Progress Notes (Signed)
No current outpatient prescriptions on file prior to visit.   No current facility-administered medications on file prior to visit.     Chief Complaint  Patient presents with  . Follow-up    Wears CPAP nightly. Notes some issues with removing mask during the night without knowing. Pt also notes a current cold which has interfered with wearing the CPAP consistently.      Tests HST 10/21/14 >> AHI 13.9, SaO2 low 83%. Auto CPAP 01/05/15 to 02/03/15 >> used on 28 of 30 nights with average 7 hrs and 0 min.  Average AHI is 1.4 with median CPAP 6 cm H2O and 95 th percentile CPAP 8 cm H20.  Past medical hx HLD, anemia  Past surgical hx, Allergies, Family hx, Social hx all reviewed.  Vital Signs BP 112/80 mmHg  Pulse 87  Ht 5\' 8"  (1.727 m)  Wt 193 lb 12.8 oz (87.907 kg)  BMI 29.47 kg/m2  SpO2 96%  History of Present Illness Drew Ochoa is a 50 y.o. male with OSA.  He has been using CPAP nightly.  He has nasal pillows.  He gets occasional sinus congestion.  He is sleeping better, and his wife is sleeping better.  His mask comes off sometimes at night, but then he puts it back on when he wakes up.   Physical Exam  General - No distress ENT - No sinus tenderness, no oral exudate, no LAN, MP 4, enlarged tongue, slight over bite Cardiac - s1s2 regular, no murmur Chest - No wheeze/rales/dullness Back - No focal tenderness Abd - Soft, non-tender Ext - No edema Neuro - Normal strength Skin - No rashes Psych - normal mood, and behavior   Assessment/Plan  Obstructive sleep apnea. Plan: - continue auto CPAP - he will call if he wants to try different mask type, or nasal sprays to help with sinus congestion >> he can try adjusting humidifier setting for now and continue with breath rite strips as needed   Patient Instructions  Follow up in 1 year     Chesley Mires, MD [place] Pager:  [phone]

## 2015-05-31 ENCOUNTER — Ambulatory Visit: Payer: Self-pay | Admitting: Internal Medicine

## 2015-10-03 ENCOUNTER — Other Ambulatory Visit: Payer: Managed Care, Other (non HMO) | Admitting: Internal Medicine

## 2015-10-03 DIAGNOSIS — Z0000 Encounter for general adult medical examination without abnormal findings: Secondary | ICD-10-CM

## 2015-10-03 LAB — CBC WITH DIFFERENTIAL/PLATELET
BASOS PCT: 1 %
Basophils Absolute: 52 cells/uL (ref 0–200)
Eosinophils Absolute: 104 cells/uL (ref 15–500)
Eosinophils Relative: 2 %
HEMATOCRIT: 41.4 % (ref 38.5–50.0)
HEMOGLOBIN: 14.1 g/dL (ref 13.2–17.1)
Lymphocytes Relative: 33 %
Lymphs Abs: 1716 cells/uL (ref 850–3900)
MCH: 28.3 pg (ref 27.0–33.0)
MCHC: 34.1 g/dL (ref 32.0–36.0)
MCV: 83 fL (ref 80.0–100.0)
MONO ABS: 468 {cells}/uL (ref 200–950)
MPV: 9.2 fL (ref 7.5–12.5)
Monocytes Relative: 9 %
NEUTROS ABS: 2860 {cells}/uL (ref 1500–7800)
Neutrophils Relative %: 55 %
Platelets: 303 10*3/uL (ref 140–400)
RBC: 4.99 MIL/uL (ref 4.20–5.80)
RDW: 13.8 % (ref 11.0–15.0)
WBC: 5.2 10*3/uL (ref 3.8–10.8)

## 2015-10-03 LAB — COMPREHENSIVE METABOLIC PANEL
ALK PHOS: 46 U/L (ref 40–115)
ALT: 12 U/L (ref 9–46)
AST: 16 U/L (ref 10–40)
Albumin: 4 g/dL (ref 3.6–5.1)
BILIRUBIN TOTAL: 1 mg/dL (ref 0.2–1.2)
BUN: 11 mg/dL (ref 7–25)
CALCIUM: 9.2 mg/dL (ref 8.6–10.3)
CO2: 25 mmol/L (ref 20–31)
Chloride: 105 mmol/L (ref 98–110)
Creat: 1.2 mg/dL (ref 0.60–1.35)
GLUCOSE: 85 mg/dL (ref 65–99)
Potassium: 4.6 mmol/L (ref 3.5–5.3)
Sodium: 140 mmol/L (ref 135–146)
TOTAL PROTEIN: 6.6 g/dL (ref 6.1–8.1)

## 2015-10-03 LAB — LIPID PANEL
CHOLESTEROL: 214 mg/dL — AB (ref 125–200)
HDL: 74 mg/dL (ref >=40)
LDL Cholesterol: 128 mg/dL (ref <130)
Total CHOL/HDL Ratio: 2.9 Ratio (ref <=5.0)
Triglycerides: 62 mg/dL (ref <150)
VLDL: 12 mg/dL (ref <30)

## 2015-10-07 ENCOUNTER — Encounter: Payer: Self-pay | Admitting: Internal Medicine

## 2015-10-07 ENCOUNTER — Ambulatory Visit (INDEPENDENT_AMBULATORY_CARE_PROVIDER_SITE_OTHER): Payer: Managed Care, Other (non HMO) | Admitting: Internal Medicine

## 2015-10-07 VITALS — BP 116/82 | HR 85 | Temp 97.8°F | Ht 68.0 in | Wt 188.0 lb

## 2015-10-07 DIAGNOSIS — Z96651 Presence of right artificial knee joint: Secondary | ICD-10-CM | POA: Diagnosis not present

## 2015-10-07 DIAGNOSIS — Z0000 Encounter for general adult medical examination without abnormal findings: Secondary | ICD-10-CM | POA: Diagnosis not present

## 2015-10-07 DIAGNOSIS — Z9889 Other specified postprocedural states: Secondary | ICD-10-CM | POA: Diagnosis not present

## 2015-10-07 DIAGNOSIS — K648 Other hemorrhoids: Secondary | ICD-10-CM | POA: Diagnosis not present

## 2015-10-07 DIAGNOSIS — R7989 Other specified abnormal findings of blood chemistry: Secondary | ICD-10-CM | POA: Diagnosis not present

## 2015-10-07 LAB — POCT URINALYSIS DIPSTICK
Bilirubin, UA: NEGATIVE
Blood, UA: NEGATIVE
Glucose, UA: NEGATIVE
KETONES UA: NEGATIVE
LEUKOCYTES UA: NEGATIVE
NITRITE UA: NEGATIVE
PH UA: 7
PROTEIN UA: NEGATIVE
Spec Grav, UA: <=1.005
UROBILINOGEN UA: NEGATIVE

## 2015-10-07 NOTE — Progress Notes (Signed)
Subjective:    Patient ID: Drew Ochoa, male    DOB: 01-Jan-1966, 50 y.o.   MRN: [id number]  HPI 50 year old male for health maintenance exam.History of sleep apnea. Has been seen by Dr. Halford Chessman and wears C-pap device. He had right rotator cuff repair by Dr. Daylene Katayama in 2014. He had partial knee right replacement by Dr. Mardelle Matte in 2016.  He has a remote history of an deficiency anemia thought to be secondary to significant rectal bleeding from hemorrhoids. He went through a thorough GI workup in 2012. He subsequently took iron supplements and now has a normal hemoglobin and seldom has rectal bleeding. Colonoscopy October 2012 showed large internal hemorrhoids. He had an upper endoscopy showing antral gastritis. He had 2 small sessile colon polyps and pathology revealed them to be hyperplastic. When he was initially evaluated in August 2012 he had an iron level 28 with a total iron-binding capacity of 458. Hemoglobin at that time was 11.8 g with an MCV of 75.3. Also at that time his total cholesterol was 238 with an LDL cholesterol of 154 and HDL cholesterol of 69. Triglycerides were 75. He is now normalized his elevated LDL cholesterol and has a very high HDL cholesterol because I think of exercise that he does very regularly. In January 2014 he had de- arterialization of internal hemorrhoids by Dr. Leighton Ruff.  Social history: He is married. Works for [employer]. No children. Social alcohol consumption. Occasionally smokes cigars.   Family history: Grandmother with massive stroke at age 42 and died at age 68. Maternal grandfather had CABG at age 31 and died at age 24. Paternal grandfather with history of alcoholism the recovered at age 26, had CABG at 70 and died at age 90. Paternal grandmother in good health. Mother with history of depression and skin cancer. Father with history of rheumatoid arthritis diagnosed at age 64 and also history of colon polyps.      Review of Systems    Constitutional: Negative.   All other systems reviewed and are negative.      Objective:   Physical Exam  Constitutional: He is oriented to person, place, and time. He appears well-developed and well-nourished. No distress.  HENT:  Head: Normocephalic and atraumatic.  Right Ear: External ear normal.  Mouth/Throat: Oropharynx is clear and moist.  Eyes: Conjunctivae and EOM are normal. Pupils are equal, round, and reactive to light. Right eye exhibits no discharge. Left eye exhibits no discharge.  Neck: Neck supple. No JVD present. No thyromegaly present.  Cardiovascular: Normal rate, regular rhythm, normal heart sounds and intact distal pulses.   No murmur heard. Pulmonary/Chest: Effort normal and breath sounds normal. He has no wheezes. He has no rales.  Abdominal: Soft. Bowel sounds are normal. He exhibits no distension. There is no tenderness. There is no rebound and no guarding.  Genitourinary:  Genitourinary Comments: No hernias to direct palpation. Scrotum normal.  Musculoskeletal: He exhibits no edema.  Neurological: He is alert and oriented to person, place, and time. He has normal reflexes. No cranial nerve deficit. Coordination normal.  Skin: Skin is warm and dry. No rash noted. He is not diaphoretic.  Psychiatric: He has a normal mood and affect. His behavior is normal. Judgment and thought content normal.  Vitals reviewed.         Assessment & Plan:  Normal health maintenance exam  History of internal hemorrhoids that bled extensively and had de-arterialization by surgeon. Had iron deficiency anemia due to internal hemorrhoidal  bleeding  History of right shoulder rotator cuff repair 2014  Has high HDL cholesterol  Plan: Return in one year or as needed. To obtain flu vaccine through employment.  History of partial right knee replacement 2016

## 2015-10-07 NOTE — Patient Instructions (Signed)
It was pleasure to see you today. Continue diet and exercise regimen. Return in one year or as needed.

## 2016-09-21 ENCOUNTER — Encounter: Payer: Self-pay | Admitting: Internal Medicine

## 2016-09-21 ENCOUNTER — Ambulatory Visit (INDEPENDENT_AMBULATORY_CARE_PROVIDER_SITE_OTHER): Payer: 59 | Admitting: Internal Medicine

## 2016-09-21 VITALS — BP 104/70 | HR 78 | Temp 97.6°F | Ht 68.0 in | Wt 187.0 lb

## 2016-09-21 DIAGNOSIS — Z130 Encounter for screening for diseases of the blood and blood-forming organs and certain disorders involving the immune mechanism: Secondary | ICD-10-CM

## 2016-09-21 DIAGNOSIS — Z0000 Encounter for general adult medical examination without abnormal findings: Secondary | ICD-10-CM | POA: Diagnosis not present

## 2016-09-21 DIAGNOSIS — G4730 Sleep apnea, unspecified: Secondary | ICD-10-CM | POA: Diagnosis not present

## 2016-09-21 DIAGNOSIS — Z96651 Presence of right artificial knee joint: Secondary | ICD-10-CM

## 2016-09-21 DIAGNOSIS — E7800 Pure hypercholesterolemia, unspecified: Secondary | ICD-10-CM

## 2016-09-21 DIAGNOSIS — Z9889 Other specified postprocedural states: Secondary | ICD-10-CM

## 2016-09-21 DIAGNOSIS — R7989 Other specified abnormal findings of blood chemistry: Secondary | ICD-10-CM | POA: Diagnosis not present

## 2016-09-21 DIAGNOSIS — M25561 Pain in right knee: Secondary | ICD-10-CM | POA: Diagnosis not present

## 2016-09-21 DIAGNOSIS — Z862 Personal history of diseases of the blood and blood-forming organs and certain disorders involving the immune mechanism: Secondary | ICD-10-CM | POA: Diagnosis not present

## 2016-09-21 LAB — CBC WITH DIFFERENTIAL/PLATELET
BASOS PCT: 0.9 %
Basophils Absolute: 50 cells/uL (ref 0–200)
Eosinophils Absolute: 78 cells/uL (ref 15–500)
Eosinophils Relative: 1.4 %
HCT: 41.1 % (ref 38.5–50.0)
Hemoglobin: 14 g/dL (ref 13.2–17.1)
Lymphs Abs: 1579 cells/uL (ref 850–3900)
MCH: 28.2 pg (ref 27.0–33.0)
MCHC: 34.1 g/dL (ref 32.0–36.0)
MCV: 82.7 fL (ref 80.0–100.0)
MPV: 9.8 fL (ref 7.5–12.5)
Monocytes Relative: 9 %
NEUTROS PCT: 60.5 %
Neutro Abs: 3388 cells/uL (ref 1500–7800)
PLATELETS: 296 10*3/uL (ref 140–400)
RBC: 4.97 10*6/uL (ref 4.20–5.80)
RDW: 12.9 % (ref 11.0–15.0)
TOTAL LYMPHOCYTE: 28.2 %
WBC mixed population: 504 cells/uL (ref 200–950)
WBC: 5.6 10*3/uL (ref 3.8–10.8)

## 2016-09-21 LAB — COMPLETE METABOLIC PANEL WITH GFR
AG Ratio: 1.6 (calc) (ref 1.0–2.5)
ALKALINE PHOSPHATASE (APISO): 47 U/L (ref 40–115)
ALT: 12 U/L (ref 9–46)
AST: 15 U/L (ref 10–40)
Albumin: 3.9 g/dL (ref 3.6–5.1)
BUN: 11 mg/dL (ref 7–25)
CALCIUM: 9.1 mg/dL (ref 8.6–10.3)
CO2: 26 mmol/L (ref 20–32)
CREATININE: 1.21 mg/dL (ref 0.60–1.35)
Chloride: 103 mmol/L (ref 98–110)
GFR, EST NON AFRICAN AMERICAN: 70 mL/min/{1.73_m2} (ref >=60)
GFR, Est African American: 81 mL/min/{1.73_m2} (ref >=60)
GLOBULIN: 2.5 g/dL (ref 1.9–3.7)
GLUCOSE: 86 mg/dL (ref 65–99)
Potassium: 4.4 mmol/L (ref 3.5–5.3)
SODIUM: 138 mmol/L (ref 135–146)
TOTAL PROTEIN: 6.4 g/dL (ref 6.1–8.1)
Total Bilirubin: 0.8 mg/dL (ref 0.2–1.2)

## 2016-09-21 LAB — POCT URINALYSIS DIPSTICK
Bilirubin, UA: NEGATIVE
Glucose, UA: NEGATIVE
Ketones, UA: NEGATIVE
Leukocytes, UA: NEGATIVE
NITRITE UA: NEGATIVE
PH UA: 7 (ref 5.0–8.0)
PROTEIN UA: NEGATIVE
RBC UA: NEGATIVE
Spec Grav, UA: 1.02 (ref 1.010–1.025)
UROBILINOGEN UA: 0.2 U/dL

## 2016-09-21 LAB — LIPID PANEL
CHOLESTEROL: 225 mg/dL — AB (ref <200)
HDL: 84 mg/dL (ref >40)
LDL Cholesterol (Calc): 126 mg/dL (calc) — ABNORMAL HIGH
NON-HDL CHOLESTEROL (CALC): 141 mg/dL — AB (ref <130)
Total CHOL/HDL Ratio: 2.7 (calc) (ref <5.0)
Triglycerides: 61 mg/dL (ref <150)

## 2016-10-06 NOTE — Progress Notes (Signed)
Subjective:    Patient ID: Drew Ochoa, male    DOB: 11-16-1965, 51 y.o.   MRN: [id number]  HPI 51 year old Male in today for health maintenance exam and evaluation of medical issues. Has seen Dr. Halford Chessman and wears a C Pap device for sleep apnea. Right rotator cuff repaired by Dr. Daylene Katayama in 2014. Partial right knee replacement by Dr. Mardelle Matte in 2016.  He has a remote history of iron deficiency anemia thought to be secondary to significant rectal bleeding from hemorrhoids. He went through a thorough GI workup in 2012. He subsequently took iron supplements and now has normal hemoglobin. Seldom has rectal bleeding. Colonoscopy October 2012 showed large internal hemorrhoids. He had an upper endoscopy showing antral gastritis. He had 2 small sessile colon polyps and pathology revealed them to be hyperplastic polyps. When he initially was evaluated August 2012 he had an iron level of 28 with total iron by capacity of 458. Hemoglobin at that time was 11.8 g with an MCV of 75.3. Also at that time his total cholesterol was 238 with an LDL cholesterol of 154 and an HDL cholesterol of 69. Triglycerides were 75.  He exercises regularly.  In January 2014 he had D arterialization of internal hemorrhoids by Dr. Leighton Ruff.  Social history: He is married. No children. He works for [employer]. Social alcohol consumption. Occasionally smokes cigars.  Family history: Grandmother with massive stroke at age 58 and died at age 73. Maternal grandfather had CABG at age 63 and died at age 69. Paternal grandfather with history of alcoholism recovered at the age of 70. He had CABG at 79 and died at age 49. Paternal grandmother in good health. Mother with history of depression and skin cancer. Father with history of rheumatoid arthritis diagnosed at age 70 and also history of colon polyps.      Review of Systems continues to exercise a lot. Has begun to have some left knee pain. Continues to see orthopedist.    Objective:   Physical Exam  Constitutional: He is oriented to person, place, and time. He appears well-developed and well-nourished. No distress.  HENT:  Head: Normocephalic and atraumatic.  Right Ear: External ear normal.  Left Ear: External ear normal.  Mouth/Throat: Oropharynx is clear and moist.  Eyes: Pupils are equal, round, and reactive to light. Conjunctivae and EOM are normal. Right eye exhibits no discharge. Left eye exhibits no discharge.  Neck: No JVD present. No thyromegaly present.  Cardiovascular: Normal rate, regular rhythm and normal heart sounds.   No murmur heard. Pulmonary/Chest: Effort normal and breath sounds normal. No respiratory distress. He has no wheezes. He has no rales.  Abdominal: Soft. Bowel sounds are normal. He exhibits no distension and no mass. There is no tenderness. There is no rebound and no guarding.  Musculoskeletal: He exhibits no edema.  Lymphadenopathy:    He has no cervical adenopathy.  Neurological: He is alert and oriented to person, place, and time. He has normal reflexes. No cranial nerve deficit. Coordination normal.  Skin: Skin is warm and dry. No rash noted. He is not diaphoretic.  Psychiatric: He has a normal mood and affect. His behavior is normal. Judgment and thought content normal.  Vitals reviewed.         Assessment & Plan:  Normal health maintenance exam  History of iron deficiency anemia secondary to large internal hemorrhoids  History of right shoulder rotator cuff repair 2014  Right knee pain status post partial rightknee replacement  High HDL  cholesterol  History of sleep apnea treated with C Pap  Plan: Will obtain flu vaccine through employment. Return in one year or as needed.  Addendum: HDL cholesterol is 84. LDL is 126 and total cholesterol 225. He'll watch his diet and we will recheck this in a year. One year ago total cholesterol was 214 with LDL of 128.

## 2016-10-06 NOTE — Patient Instructions (Addendum)
It was a pleasure to see you today. Watch diet. Continue exercise program. Return in one year or as needed. Did have flu vaccine through employment.

## 2016-11-02 ENCOUNTER — Encounter: Payer: Self-pay | Admitting: Internal Medicine

## 2016-11-02 ENCOUNTER — Ambulatory Visit (INDEPENDENT_AMBULATORY_CARE_PROVIDER_SITE_OTHER): Payer: 59 | Admitting: Internal Medicine

## 2016-11-02 VITALS — BP 104/76 | HR 86 | Temp 97.8°F | Wt 187.0 lb

## 2016-11-02 DIAGNOSIS — L237 Allergic contact dermatitis due to plants, except food: Secondary | ICD-10-CM | POA: Diagnosis not present

## 2016-11-02 MED ORDER — PREDNISONE 10 MG PO TABS: ORAL_TABLET | ORAL | 1 refills | Status: DC

## 2016-11-02 NOTE — Patient Instructions (Signed)
Take prednisone taper as directed over 7 days.  If not better in 7 days have prescription refilled.  Apply calamine topically.  May take Benadryl for itching particularly before bedtime.

## 2016-11-02 NOTE — Progress Notes (Signed)
   Subjective:    Patient ID: Drew Ochoa, male    DOB: 09-08-1965, 51 y.o.   MRN: [id number]  HPI Has had weepy rash left neck since Tuesday October 23rd.  On Sunday, October 21 he and his dog ran neighbors yard.  Recently found out that neighbor has reportedly poison sumac in backyard.  Dog was running around in that area and placed Pauls on patient's left neck when he picked her up.  Dog is now had a bath.  Patient is itching a lot in the left neck area despite applying calamine lotion.    Review of Systems see above     Objective:   Physical Exam Weepy erythematous rash left neck covering a fairly large area.       Assessment & Plan:  Contact dermatitis  Plan: Prednisone 10 mg (#21) going from 60 mg to 0 mg over 7 days with 1 refill.  If rash is not completely resolved in 7 days repeat tapering course.  May take Benadryl for itching.  Apply calamine lotion to help dry up lesions.

## 2017-01-14 ENCOUNTER — Encounter: Payer: Self-pay | Admitting: Internal Medicine

## 2017-01-14 ENCOUNTER — Ambulatory Visit (INDEPENDENT_AMBULATORY_CARE_PROVIDER_SITE_OTHER): Payer: 59 | Admitting: Internal Medicine

## 2017-01-14 VITALS — BP 110/88 | HR 73 | Temp 98.0°F | Ht 68.0 in | Wt 192.0 lb

## 2017-01-14 DIAGNOSIS — M25511 Pain in right shoulder: Secondary | ICD-10-CM | POA: Diagnosis not present

## 2017-01-14 DIAGNOSIS — R109 Unspecified abdominal pain: Secondary | ICD-10-CM

## 2017-01-14 DIAGNOSIS — L570 Actinic keratosis: Secondary | ICD-10-CM

## 2017-01-14 DIAGNOSIS — Z23 Encounter for immunization: Secondary | ICD-10-CM

## 2017-01-14 DIAGNOSIS — R300 Dysuria: Secondary | ICD-10-CM | POA: Diagnosis not present

## 2017-01-14 DIAGNOSIS — G8929 Other chronic pain: Secondary | ICD-10-CM

## 2017-01-14 DIAGNOSIS — M545 Low back pain: Secondary | ICD-10-CM | POA: Diagnosis not present

## 2017-01-14 LAB — POCT URINALYSIS DIPSTICK
APPEARANCE: NORMAL
BILIRUBIN UA: NEGATIVE
Glucose, UA: NEGATIVE
Ketones, UA: NEGATIVE
Leukocytes, UA: NEGATIVE
NITRITE UA: NEGATIVE
Odor: NORMAL
PH UA: 7 (ref 5.0–8.0)
PROTEIN UA: NEGATIVE
Spec Grav, UA: 1.01 (ref 1.010–1.025)
UROBILINOGEN UA: 0.2 U/dL

## 2017-01-14 NOTE — Patient Instructions (Addendum)
Flu vaccine given.  See Dr. Mardelle Matte regarding right shoulder pain.  To have CT with stone protocol to evaluate for possible remaining kidney stones.  See dermatologist regarding actinic keratosis on scalp.  Order given for shingles vaccination.

## 2017-01-14 NOTE — Progress Notes (Signed)
   Subjective:    Patient ID: Drew Ochoa, male    DOB: [birth year], 52 y.o.   MRN: [id number]  HPI Awakened Saturday morning around 4:15 AM with severe lower right back pain.  He drank some water subsequently took some ibuprofen around 6 AM and again at 7 AM.  Subsequently pain began to radiate into right lower abdomen.  Had 4 bowel movements from 4:30 AM until 8 AM.  Yesterday he continued to have some back pain but not as severe.  He had some frequent urination but no hematuria.  Subsequently pain improved in afternoon and evening.  Seemed to get better after taking a long walk with his wife.  Today he does not have significant pain at all.  Urinalysis is unremarkable.  No prior history of kidney stones that he is aware of.  He thinks he may have passed a kidney stone and I agree with that.  He seldom drinks sodas.  Exercises a great deal.  Has been having some issues with right shoulder popping.  We will see Dr. Mardelle Matte regarding that.  Also complaining of papular scaly lesion on top of scalp      Review of Systems see above     Objective:   Physical Exam  Presently no CVA tenderness.  Urine dipstick trace nonhemolyzed occult blood but no other abnormalities.  Lesion on scalp is scaly and raised slightly.      Assessment & Plan:  Right flank and abdominal pain consistent with nephrolithiasis.  Probably passed stone.  However, he is going to have CT of abdomen and pelvis with stone protocol in the near future for further evaluation.  Regarding right shoulder pain, he will see Dr. Mardelle Matte.  Actinic keratoses right scalp-see dermatologist  Need for flu vaccine-given today  Order given for Shingrix

## 2017-03-01 ENCOUNTER — Other Ambulatory Visit: Payer: Self-pay

## 2017-03-01 DIAGNOSIS — E785 Hyperlipidemia, unspecified: Secondary | ICD-10-CM

## 2017-03-22 ENCOUNTER — Other Ambulatory Visit: Payer: 59 | Admitting: Internal Medicine

## 2017-03-25 ENCOUNTER — Ambulatory Visit: Payer: 59 | Admitting: Internal Medicine

## 2017-03-27 NOTE — Progress Notes (Signed)
Please call and see if he wants to reschedule missed appt. Was supposed to get lipid panel checked

## 2017-09-12 ENCOUNTER — Other Ambulatory Visit: Payer: 59 | Admitting: Internal Medicine

## 2017-10-29 ENCOUNTER — Other Ambulatory Visit: Payer: Self-pay | Admitting: Internal Medicine

## 2017-10-29 DIAGNOSIS — Z0000 Encounter for general adult medical examination without abnormal findings: Secondary | ICD-10-CM

## 2017-10-31 ENCOUNTER — Other Ambulatory Visit: Payer: 59 | Admitting: Internal Medicine

## 2017-10-31 DIAGNOSIS — Z0000 Encounter for general adult medical examination without abnormal findings: Secondary | ICD-10-CM

## 2017-10-31 DIAGNOSIS — R109 Unspecified abdominal pain: Secondary | ICD-10-CM

## 2017-11-01 LAB — CBC WITH DIFFERENTIAL/PLATELET
BASOS ABS: 60 {cells}/uL (ref 0–200)
Basophils Relative: 0.9 %
Eosinophils Absolute: 101 cells/uL (ref 15–500)
Eosinophils Relative: 1.5 %
HEMATOCRIT: 42.7 % (ref 38.5–50.0)
Hemoglobin: 14.5 g/dL (ref 13.2–17.1)
Lymphs Abs: 1615 cells/uL (ref 850–3900)
MCH: 27.6 pg (ref 27.0–33.0)
MCHC: 34 g/dL (ref 32.0–36.0)
MCV: 81.2 fL (ref 80.0–100.0)
MPV: 9.5 fL (ref 7.5–12.5)
Monocytes Relative: 9.9 %
NEUTROS PCT: 63.6 %
Neutro Abs: 4261 cells/uL (ref 1500–7800)
Platelets: 288 10*3/uL (ref 140–400)
RBC: 5.26 10*6/uL (ref 4.20–5.80)
RDW: 14 % (ref 11.0–15.0)
Total Lymphocyte: 24.1 %
WBC: 6.7 10*3/uL (ref 3.8–10.8)
WBCMIX: 663 {cells}/uL (ref 200–950)

## 2017-11-01 LAB — LIPID PANEL
Cholesterol: 237 mg/dL — ABNORMAL HIGH (ref <200)
HDL: 72 mg/dL (ref >40)
LDL Cholesterol (Calc): 150 mg/dL (calc) — ABNORMAL HIGH
Non-HDL Cholesterol (Calc): 165 mg/dL (calc) — ABNORMAL HIGH (ref <130)
Total CHOL/HDL Ratio: 3.3 (calc) (ref <5.0)
Triglycerides: 59 mg/dL (ref <150)

## 2017-11-01 LAB — COMPLETE METABOLIC PANEL WITH GFR
AG RATIO: 1.6 (calc) (ref 1.0–2.5)
ALKALINE PHOSPHATASE (APISO): 49 U/L (ref 40–115)
ALT: 16 U/L (ref 9–46)
AST: 17 U/L (ref 10–35)
Albumin: 4.2 g/dL (ref 3.6–5.1)
BILIRUBIN TOTAL: 0.9 mg/dL (ref 0.2–1.2)
BUN: 11 mg/dL (ref 7–25)
CO2: 27 mmol/L (ref 20–32)
Calcium: 9.2 mg/dL (ref 8.6–10.3)
Chloride: 103 mmol/L (ref 98–110)
Creat: 1.21 mg/dL (ref 0.70–1.33)
GFR, Est African American: 80 mL/min/{1.73_m2} (ref >=60)
GFR, Est Non African American: 69 mL/min/{1.73_m2} (ref >=60)
GLOBULIN: 2.6 g/dL (ref 1.9–3.7)
Glucose, Bld: 86 mg/dL (ref 65–99)
Potassium: 4.5 mmol/L (ref 3.5–5.3)
SODIUM: 138 mmol/L (ref 135–146)
Total Protein: 6.8 g/dL (ref 6.1–8.1)

## 2017-11-05 ENCOUNTER — Other Ambulatory Visit: Payer: Self-pay

## 2017-11-05 ENCOUNTER — Encounter: Payer: Self-pay | Admitting: Internal Medicine

## 2017-11-05 ENCOUNTER — Ambulatory Visit (INDEPENDENT_AMBULATORY_CARE_PROVIDER_SITE_OTHER): Payer: 59 | Admitting: Internal Medicine

## 2017-11-05 VITALS — BP 118/80 | HR 75 | Ht 68.0 in | Wt 196.0 lb

## 2017-11-05 DIAGNOSIS — R6882 Decreased libido: Secondary | ICD-10-CM | POA: Diagnosis not present

## 2017-11-05 DIAGNOSIS — M170 Bilateral primary osteoarthritis of knee: Secondary | ICD-10-CM

## 2017-11-05 DIAGNOSIS — E7800 Pure hypercholesterolemia, unspecified: Secondary | ICD-10-CM

## 2017-11-05 DIAGNOSIS — Z0000 Encounter for general adult medical examination without abnormal findings: Secondary | ICD-10-CM

## 2017-11-05 DIAGNOSIS — Z23 Encounter for immunization: Secondary | ICD-10-CM | POA: Diagnosis not present

## 2017-11-05 DIAGNOSIS — E559 Vitamin D deficiency, unspecified: Secondary | ICD-10-CM | POA: Diagnosis not present

## 2017-11-05 NOTE — Patient Instructions (Addendum)
Testosterone drawn and pending. Colonoscopy needed. RTC late January for repeat lipid panel, vitamin D level and and OV. Flu vaccine given.Take 2000 units Vitamin D daily. Have level checked in January.

## 2017-11-05 NOTE — Progress Notes (Signed)
Subjective:    Patient ID: Drew Ochoa, male    DOB: 10/29/1965, 52 y.o.   MRN: [id number]  HPI 52 year old White Male for health maintenance exam and evaluation of medical issues.Has pure hypercholesterolemia. RTC in 3 months after trial of diet and exercise.  Not able to exercise quite as much due to bilateral knee pain.  Will be seeing orthopedist Dr. Mardelle Matte in the near future.  Thinks he may have to have a total right knee arthroplasty.  He has had a partial right knee arthroplasty in 2016.  May be looking at left knee arthroplasty after that.  Family history of hyperlipidemia.  He wears a CPAP device for sleep apnea.  Right rotator cuff repair by Dr. Daylene Katayama 2014.   In January 2014 he had transanal hemorrhoidal dearterialization by Dr. Leighton Ruff.  He has a remote history of iron deficiency anemia thought to be significant to significant rectal bleeding from hemorrhoids.  He went through a thorough GI work-up in 2012.  He took iron supplements.  Hemoglobin now normal.  Seldom has rectal bleeding.  Had colonoscopy October 2012 showing large internal hemorrhoids.  Had upper endoscopy showing antral gastritis.  Had 2 small sessile colon polyps and pathology revealed hyperplastic polyps.  When he initially was evaluated August 2012 he had an iron level of 28 with an iron binding capacity of 458.  Hemoglobin at that time was 11.8 g with an MCV of 75.3.  Also at that time in 2012 total cholesterol was 238 with an LDL cholesterol of 154 and HDL cholesterol of 69.  Triglycerides were 75.  Recently has been traveling to [place] and ate some deep dish pizza.  He has an elevated total and LDL cholesterol.  He will follow-up in late January for this.  He may need to be put on statin therapy.  Social history: He is married.  No children.  Social alcohol consumption.  Occasionally smokes cigars.  He works for [employer].  Family history: Grandmother with massive stroke at age 69 and  died at age 54.  Maternal grandfather had CABG at age 28 and died at age 39.  Paternal grandfather with history of alcoholism recovered at the age of 29.  He subsequently had CABG at age 44 and died at age 11.  Paternal grandmother in good health.  Mother with history of depression and skin cancer.  Father with history of rheumatoid arthritis diagnosed at age 50 and also history of colon polyps.   Review of Systems walks the dog some.  Not able to run much because of right knee pain.     Objective:   Physical Exam  Constitutional: He is oriented to person, place, and time. He appears well-developed and well-nourished. No distress.  HENT:  Head: Normocephalic and atraumatic.  Right Ear: External ear normal.  Left Ear: External ear normal.  Mouth/Throat: Oropharynx is clear and moist. No oropharyngeal exudate.  Eyes: Pupils are equal, round, and reactive to light. Conjunctivae are normal. Right eye exhibits no discharge. Left eye exhibits no discharge. No scleral icterus.  Neck: Neck supple. No JVD present. No thyromegaly present.  Cardiovascular: Normal rate, regular rhythm and normal heart sounds. Exam reveals no friction rub.  No murmur heard. Pulmonary/Chest: Effort normal. No stridor. No respiratory distress. He has no wheezes. He has no rales.  Abdominal: Soft. Bowel sounds are normal. He exhibits no distension and no mass. There is no tenderness. There is no rebound and no guarding.  Genitourinary:  Prostate normal.  Genitourinary Comments: PSA drawn and pending.  Testosterone checked at patient request  Lymphadenopathy:    He has no cervical adenopathy.  Neurological: He is alert and oriented to person, place, and time. He displays normal reflexes. No cranial nerve deficit or sensory deficit. He exhibits normal muscle tone. Coordination normal.  Skin: Skin is warm and dry. No rash noted. He is not diaphoretic.  Psychiatric: He has a normal mood and affect. His behavior is normal.  Judgment and thought content normal.  Vitals reviewed.         Assessment & Plan:  History of significant rectal bleeding due to hemorrhoids requiring dearterialization of hemorrhoids with no further issues.  Pure hypercholesterolemia-family history of stroke and CABG-watch lipid panel.  Return in 3 months for repeat lipid panel.  Consider statin therapy if persistently elevated  Decreased libido-check testosterone  Status post right partial knee arthroplasty.  Is looking at total right knee arthroplasty in the near future and left knee arthroplasty after that.  History of sleep apnea  History of right shoulder rotator cuff repair 2014  Vitamin D deficiency-had labs drawn through employment and level was low at 18.59.  Take 2000 units vitamin D3 daily.  Can follow-up in January at times lipid panel is rechecked.  Plan: Return in late January for fasting lipid panel and follow-up on hyperlipidemia.

## 2017-11-06 LAB — TESTOSTERONE: TESTOSTERONE: 493 ng/dL (ref 250–827)

## 2017-11-12 ENCOUNTER — Encounter: Payer: Self-pay | Admitting: *Deleted

## 2017-11-18 ENCOUNTER — Encounter: Payer: Self-pay | Admitting: Internal Medicine

## 2018-02-14 ENCOUNTER — Ambulatory Visit (INDEPENDENT_AMBULATORY_CARE_PROVIDER_SITE_OTHER): Payer: 59 | Admitting: Internal Medicine

## 2018-02-14 ENCOUNTER — Encounter: Payer: Self-pay | Admitting: Internal Medicine

## 2018-02-14 VITALS — BP 120/88 | HR 90 | Temp 98.6°F | Ht 68.0 in | Wt 193.0 lb

## 2018-02-14 DIAGNOSIS — J22 Unspecified acute lower respiratory infection: Secondary | ICD-10-CM

## 2018-02-14 DIAGNOSIS — J029 Acute pharyngitis, unspecified: Secondary | ICD-10-CM | POA: Diagnosis not present

## 2018-02-14 LAB — POCT RAPID STREP A (OFFICE): Rapid Strep A Screen: NEGATIVE

## 2018-02-14 MED ORDER — BENZONATATE 100 MG PO CAPS: 100.0000 mg | ORAL_CAPSULE | Freq: Three times a day (TID) | ORAL | 1 refills | Status: DC | PRN

## 2018-02-14 MED ORDER — AZITHROMYCIN 250 MG PO TABS: ORAL_TABLET | ORAL | 0 refills | Status: DC

## 2018-03-08 NOTE — Progress Notes (Signed)
   Subjective:    Patient ID: Drew Ochoa, male    DOB: [birth year], 53 y.o.   MRN: [id number]  HPI 53 year old Male in today with respiratory infection symptoms with cough and nasal congestion.  Has malaise and fatigue.  Slight sore throat.  No fever or shaking chills or myalgias.    Review of Systems see above- no recent international travel     Objective:   Physical Exam Blood pressure 120/88, pulse 90, temperature 98.6 degrees orally pulse oximetry 98%  Skin: Warm and dry  Nodes: None.  Neck is supple without adenopathy.  Chest clear to auscultation without rales or wheezing.  Pharynx is slightly injected and rapid strep screen is negative.  TMs are clear.        Assessment & Plan:  Acute lower respiratory infection  Non-strep pharyngitis  Plan: Tessalon Perles 100 mg 3 times daily as needed for cough.  Zithromax Z-PAK take 2 tablets day 1 followed by 1 tablet days 2 through 5.  Rest and drink plenty of fluids.

## 2018-03-08 NOTE — Patient Instructions (Signed)
Rest and drink plenty of fluids.  Tylenol if needed for sore throat pain.  Take Tessalon Perles 100 mg 3 times daily as needed for cough.  Take Zithromax Z-PAK as directed.

## 2018-08-04 ENCOUNTER — Telehealth: Payer: Self-pay | Admitting: Internal Medicine

## 2018-08-04 DIAGNOSIS — Z20822 Contact with and (suspected) exposure to covid-19: Secondary | ICD-10-CM

## 2018-08-04 NOTE — Telephone Encounter (Signed)
Please tell him he is welcome to go to tent at [facility] however it is taking 14 days currently or more to get results back.

## 2018-08-04 NOTE — Telephone Encounter (Signed)
Patient was notified. Order has been put in.

## 2018-08-04 NOTE — Telephone Encounter (Signed)
Drew Ochoa [phone]  Tim called to say he has been doing a lot of traveling, went to [place] to check on Grandmother, came home them went to [place] to move his parents from [place] to [place] and then back to [place]. He is not having any symptoms but was wandering if he should be tested.

## 2018-10-22 ENCOUNTER — Encounter: Payer: Self-pay | Admitting: Internal Medicine

## 2018-10-22 ENCOUNTER — Ambulatory Visit (INDEPENDENT_AMBULATORY_CARE_PROVIDER_SITE_OTHER): Payer: 59 | Admitting: Internal Medicine

## 2018-10-22 ENCOUNTER — Telehealth: Payer: Self-pay | Admitting: Internal Medicine

## 2018-10-22 ENCOUNTER — Other Ambulatory Visit: Payer: Self-pay

## 2018-10-22 VITALS — BP 130/80 | HR 78 | Temp 98.2°F | Ht 68.0 in | Wt 193.0 lb

## 2018-10-22 DIAGNOSIS — H109 Unspecified conjunctivitis: Secondary | ICD-10-CM | POA: Diagnosis not present

## 2018-10-22 MED ORDER — OFLOXACIN 0.3 % OP SOLN: 2.0000 [drp] | Freq: Four times a day (QID) | OPHTHALMIC | 0 refills | Status: DC

## 2018-10-22 NOTE — Telephone Encounter (Signed)
Scheduled appointment

## 2018-10-22 NOTE — Telephone Encounter (Signed)
He can come in today

## 2018-10-22 NOTE — Telephone Encounter (Signed)
[place] [phone]  Tim called to say that on Saturday when he woker up his right eye lid was swollen, it is still that way, does not hurt, itches once in a while, the eye is not red. Since it is not getting better he was wandering if he could come in for you to look at it.

## 2018-10-22 NOTE — Progress Notes (Signed)
   Subjective:    Patient ID: Drew Ochoa, male    DOB: 1965-10-18, 53 y.o.   MRN: [id number]  HPI 53 year old Male in good health in today with right eyelid swelling for a couple of days.  He exercises a lot and has been outdoors recently.  No fever chills cough or respiratory infection symptoms.  He has not noticed any drainage from the just some right lid swelling.       Review of Systems see above denies sore throat cough or ear pain     Objective:   Physical Exam Temperature 98.2 degrees blood pressure 130/80 pulse 78 weight 193 pounds BMI 29.40  He has mild erythema and swelling of his right upper eyelid.  Extraocular movements of the right eye are normal.  PERRLA.  No drainage noted from right.  However conjunctivae right eye inflamed and red       Assessment & Plan:  Conjunctivitis right eye-likely bacterial  Plan: Ocuflox ophthalmic drops 2 drops in right eye 4 times a day for 5 to 7 days.  Warm hot compresses 15 to 20 minutes right eye twice daily.  Change pillowcases daily and  wash cloth daily until infection resolves

## 2018-11-02 ENCOUNTER — Encounter: Payer: Self-pay | Admitting: Internal Medicine

## 2018-11-02 NOTE — Patient Instructions (Addendum)
Warm hot compresses to right 20 minutes  twice daily.  Ocuflox ophthalmic drops 2 drops in right eye 4 times a day for 5 to 7 days.  Change pillowcases and washcloth daily until problem has resolved

## 2018-11-10 ENCOUNTER — Other Ambulatory Visit: Payer: 59 | Admitting: Internal Medicine

## 2018-11-10 ENCOUNTER — Other Ambulatory Visit: Payer: Self-pay

## 2018-11-10 DIAGNOSIS — E7800 Pure hypercholesterolemia, unspecified: Secondary | ICD-10-CM

## 2018-11-10 DIAGNOSIS — M170 Bilateral primary osteoarthritis of knee: Secondary | ICD-10-CM

## 2018-11-10 DIAGNOSIS — E559 Vitamin D deficiency, unspecified: Secondary | ICD-10-CM

## 2018-11-10 DIAGNOSIS — Z125 Encounter for screening for malignant neoplasm of prostate: Secondary | ICD-10-CM

## 2018-11-10 DIAGNOSIS — Z0000 Encounter for general adult medical examination without abnormal findings: Secondary | ICD-10-CM

## 2018-11-10 DIAGNOSIS — G4733 Obstructive sleep apnea (adult) (pediatric): Secondary | ICD-10-CM

## 2018-11-11 ENCOUNTER — Ambulatory Visit (INDEPENDENT_AMBULATORY_CARE_PROVIDER_SITE_OTHER): Payer: 59 | Admitting: Internal Medicine

## 2018-11-11 ENCOUNTER — Encounter: Payer: Self-pay | Admitting: Internal Medicine

## 2018-11-11 VITALS — BP 130/90 | HR 80 | Temp 98.2°F | Ht 68.0 in | Wt 179.0 lb

## 2018-11-11 DIAGNOSIS — Z0000 Encounter for general adult medical examination without abnormal findings: Secondary | ICD-10-CM

## 2018-11-11 DIAGNOSIS — E7800 Pure hypercholesterolemia, unspecified: Secondary | ICD-10-CM

## 2018-11-11 DIAGNOSIS — G4730 Sleep apnea, unspecified: Secondary | ICD-10-CM

## 2018-11-11 DIAGNOSIS — M25562 Pain in left knee: Secondary | ICD-10-CM

## 2018-11-11 DIAGNOSIS — Z23 Encounter for immunization: Secondary | ICD-10-CM | POA: Diagnosis not present

## 2018-11-11 DIAGNOSIS — G8929 Other chronic pain: Secondary | ICD-10-CM

## 2018-11-11 DIAGNOSIS — M25561 Pain in right knee: Secondary | ICD-10-CM | POA: Diagnosis not present

## 2018-11-11 LAB — LIPID PANEL
Cholesterol: 252 mg/dL — ABNORMAL HIGH (ref <200)
HDL: 88 mg/dL (ref >=40)
LDL Cholesterol (Calc): 148 mg/dL (calc) — ABNORMAL HIGH
Non-HDL Cholesterol (Calc): 164 mg/dL (calc) — ABNORMAL HIGH (ref <130)
Total CHOL/HDL Ratio: 2.9 (calc) (ref <5.0)
Triglycerides: 56 mg/dL (ref <150)

## 2018-11-11 LAB — POCT URINALYSIS DIPSTICK
Appearance: NEGATIVE
Bilirubin, UA: NEGATIVE
Blood, UA: NEGATIVE
Clarity, UA: NEGATIVE
Glucose, UA: NEGATIVE
Ketones, UA: NEGATIVE
Leukocytes, UA: NEGATIVE
Nitrite, UA: NEGATIVE
Odor: NEGATIVE
Protein, UA: NEGATIVE
Spec Grav, UA: 1.01 (ref 1.010–1.025)
Urobilinogen, UA: 0.2 E.U./dL
pH, UA: 6.5 (ref 5.0–8.0)

## 2018-11-11 LAB — COMPLETE METABOLIC PANEL WITH GFR
AG Ratio: 1.6 (calc) (ref 1.0–2.5)
ALT: 11 U/L (ref 9–46)
AST: 16 U/L (ref 10–35)
Albumin: 4.3 g/dL (ref 3.6–5.1)
Alkaline phosphatase (APISO): 51 U/L (ref 35–144)
BUN: 13 mg/dL (ref 7–25)
CO2: 26 mmol/L (ref 20–32)
Calcium: 9.6 mg/dL (ref 8.6–10.3)
Chloride: 104 mmol/L (ref 98–110)
Creat: 1.22 mg/dL (ref 0.70–1.33)
GFR, Est African American: 79 mL/min/{1.73_m2} (ref >=60)
GFR, Est Non African American: 68 mL/min/{1.73_m2} (ref >=60)
Globulin: 2.7 g/dL (calc) (ref 1.9–3.7)
Glucose, Bld: 84 mg/dL (ref 65–99)
Potassium: 4.9 mmol/L (ref 3.5–5.3)
Sodium: 140 mmol/L (ref 135–146)
Total Bilirubin: 1 mg/dL (ref 0.2–1.2)
Total Protein: 7 g/dL (ref 6.1–8.1)

## 2018-11-11 LAB — CBC WITH DIFFERENTIAL/PLATELET
Absolute Monocytes: 461 cells/uL (ref 200–950)
Basophils Absolute: 49 cells/uL (ref 0–200)
Basophils Relative: 1 %
Eosinophils Absolute: 108 cells/uL (ref 15–500)
Eosinophils Relative: 2.2 %
HCT: 42 % (ref 38.5–50.0)
Hemoglobin: 13.8 g/dL (ref 13.2–17.1)
Lymphs Abs: 1534 cells/uL (ref 850–3900)
MCH: 26.8 pg — ABNORMAL LOW (ref 27.0–33.0)
MCHC: 32.9 g/dL (ref 32.0–36.0)
MCV: 81.6 fL (ref 80.0–100.0)
MPV: 9.8 fL (ref 7.5–12.5)
Monocytes Relative: 9.4 %
Neutro Abs: 2749 cells/uL (ref 1500–7800)
Neutrophils Relative %: 56.1 %
Platelets: 284 10*3/uL (ref 140–400)
RBC: 5.15 10*6/uL (ref 4.20–5.80)
RDW: 13.3 % (ref 11.0–15.0)
Total Lymphocyte: 31.3 %
WBC: 4.9 10*3/uL (ref 3.8–10.8)

## 2018-11-11 LAB — PSA: PSA: 1.1 ng/mL (ref <=4.0)

## 2018-11-11 LAB — VITAMIN D 25 HYDROXY (VIT D DEFICIENCY, FRACTURES): Vit D, 25-Hydroxy: 17 ng/mL — ABNORMAL LOW (ref 30–100)

## 2018-11-11 MED ORDER — ROSUVASTATIN CALCIUM 5 MG PO TABS: ORAL_TABLET | ORAL | 3 refills | Status: DC

## 2018-11-11 NOTE — Progress Notes (Signed)
   Subjective:    Patient ID: Drew Ochoa, male    DOB: Dec 30, 1965, 53 y.o.   MRN: [id number]  HPI 53 year old Male for health maintenance exam and evaluation of medical issues.  Has bilateral knee pain.  Not able to exercise quite as much.  Had partial right knee arthroplasty 2016.  History of sleep apnea and wears CPAP device.  In January 2014 he had transanal hemorrhoidal dearterialization by Dr. Leighton Ruff.  He has a remote history of iron deficiency anemia thought to be due to significant rectal bleeding from hemorrhoids.  Had thorough GI work-up in 2012 and took iron supplements.  Seldom has rectal bleeding now.  Hemoglobin is normal.  Right rotator cuff repair by Dr. Daylene Katayama 2014.   2 small  benign hyperplastic polyps in rectum on colonoscopy 2012 with recall due 2019. Pt will call for appt. Also, at same time (October 2012) he had upper endoscopy showing antral gastritis.   Has had one Shingrix vaccine at [facility]. Will get another one soon.   Maternal Great grandmother had CVA and paternal grandmother had stroke at age 73. Father has hyperlipidemia. Lipids. Mother with stroke and depression.  Family history of hyperlipidemia and he himself has hyperlipidemia.  Total cholesterol 252, LDL cholesterol 148.  HDL is 88 and triglycerides 56.  He is starting Crestor 5 mg 3 times a week with supper with follow-up in a few weeks.  Social history: He is married.  No children.  Social alcohol consumption.  Occasionally smokes cigars.  He works for [employer].  Family history: Grandmother with massive stroke at age 39 and died at age 37.  Maternal grandfather had CABG at age 57 and died at age 32.  Paternal grandfather with history of alcoholism recovered at age 53.  He subsequently had CABG at age 10 and died at age 79.  Paternal grandmother in good health.  Mother with history of depression and skin cancer.  Father with history of rheumatoid arthritis diagnosed at age 66 and also  with history of colon polyps.  Review of Systems Has lost 14 pounds recently- may have eating better. Right knee pain with exercise like several mile hike or biking.     Objective:   Physical Exam BP 130/90 BMI 27.22 pulse 80 temperature 98.2 pulse oximetry 98% weight 179 pounds height 5 feet 8 inches  Skin warm and dry.  Nodes none.  TMs clear.  Neck is supple without JVD thyromegaly or carotid bruits.  Chest clear to auscultation.  Cardiac exam regular rate and rhythm normal S1 and S2 without murmurs or gallops.  Abdomen soft nondistended without hepatosplenomegaly masses or tenderness.  Prostate is normal.  No lower extremity edema.       Assessment & Plan:  Hyperlipidemia-has family history.  Start Crestor 5 mg po 3 times a week and follow-up early February.  History of sleep apnea-uses CPAP machine  History of right shoulder rotator cuff repair in 2014  History of significant rectal bleeding due to hemorrhoids status post dearterialization of hemorrhoids with no further issues  Health maintenance-time for colonoscopy  Status post right partial knee arthroplasty.  Likely needs total right knee arthroplasty and possibly left knee arthroplasty due to bilateral knee pain that interferes with his running and exercise regimen.  Plan: Follow-up in early February for hyperlipidemia on statin therapy.  Flu vaccine given.  Needs second Shingrix vaccine.

## 2018-11-11 NOTE — Patient Instructions (Signed)
Crestor 5 mg 3 times a week. RTC in 3 months. Flu vaccine given today. Please contact Dr. Hilarie Fredrickson for colonoscopy. Labs reviewed. PSA normal. Complete Shingrix series.

## 2019-02-06 ENCOUNTER — Other Ambulatory Visit: Payer: 59 | Admitting: Internal Medicine

## 2019-02-06 ENCOUNTER — Other Ambulatory Visit: Payer: Self-pay

## 2019-02-06 DIAGNOSIS — E7800 Pure hypercholesterolemia, unspecified: Secondary | ICD-10-CM

## 2019-02-06 LAB — LIPID PANEL
Cholesterol: 186 mg/dL (ref <200)
HDL: 74 mg/dL (ref >=40)
LDL Cholesterol (Calc): 98 mg/dL (calc)
Non-HDL Cholesterol (Calc): 112 mg/dL (calc) (ref <130)
Total CHOL/HDL Ratio: 2.5 (calc) (ref <5.0)
Triglycerides: 57 mg/dL (ref <150)

## 2019-02-06 LAB — HEPATIC FUNCTION PANEL
AG Ratio: 2 (calc) (ref 1.0–2.5)
ALT: 12 U/L (ref 9–46)
AST: 17 U/L (ref 10–35)
Albumin: 4.2 g/dL (ref 3.6–5.1)
Alkaline phosphatase (APISO): 39 U/L (ref 35–144)
Bilirubin, Direct: 0.2 mg/dL (ref 0.0–0.2)
Globulin: 2.1 g/dL (calc) (ref 1.9–3.7)
Indirect Bilirubin: 0.5 mg/dL (calc) (ref 0.2–1.2)
Total Bilirubin: 0.7 mg/dL (ref 0.2–1.2)
Total Protein: 6.3 g/dL (ref 6.1–8.1)

## 2019-02-10 ENCOUNTER — Encounter: Payer: Self-pay | Admitting: Internal Medicine

## 2019-02-10 ENCOUNTER — Other Ambulatory Visit: Payer: Self-pay

## 2019-02-10 ENCOUNTER — Ambulatory Visit (INDEPENDENT_AMBULATORY_CARE_PROVIDER_SITE_OTHER): Payer: 59 | Admitting: Internal Medicine

## 2019-02-10 VITALS — BP 100/70 | HR 70 | Temp 97.9°F | Ht 68.0 in | Wt 182.0 lb

## 2019-02-10 DIAGNOSIS — E7800 Pure hypercholesterolemia, unspecified: Secondary | ICD-10-CM

## 2019-02-10 NOTE — Progress Notes (Signed)
   Subjective:    Patient ID: Drew Ochoa, male    DOB: [birth year], 54 y.o.   MRN: [id number]  HPI 54 year old Male in to follow up on hyperlipidemia.  Is here for health maintenance exam and lipids were elevated.  Total cholesterol was 252 with an LDL cholesterol of 148.  A year previously total cholesterol was 237 with LDL cholesterol of 150.  He was started on Crestor 5 mg 3 times a week with supper which he has tolerated well.  Lipid panel has now normalized.  Total cholesterol was 186, HDL 74, triglycerides 57 and LDL cholesterol 98.  Liver functions are entirely within normal limits.  Tells me he is going to have to have right knee arthroplasty in the near future.  Having considerable right knee pain and is making it difficult for him to exercise.  His general health is excellent and I see no issues with him having knee surgery.  His vitamin D level was also low in November.  He is now taking vitamin D supplement.   Review of Systems  Respiratory: Negative.   Cardiovascular: Negative.   Gastrointestinal: Negative.   Musculoskeletal:       Right knee pain   no new complaints     Objective:   Physical Exam Blood pressure 100/70 pulse 70 temperature 97.9 degrees orally pulse oximetry 99% weight 182 pounds BMI 27.67  Entirely normal.  Total cholesterol was 252 186, LDL was 148 and is now 98, HDL is 74 and previously was 88 triglycerides are 57 and previously were 56.      Assessment & Plan:  Pure hypercholesterolemia improved on low-dose statin therapy 3 times a week  Plan: Continue statin therapy with Crestor 5 mg 3 times a week.  Physical exam due later this year.  Needs anticipating right knee arthroplasty in the near future.

## 2019-02-10 NOTE — Patient Instructions (Signed)
I am pleased with your. response to Crestor.  Good luck with right knee arthroplasty.  Continue Crestor as prescribed.  Schedule physical exam for later this year.  It was a pleasure to see you today.

## 2019-02-20 NOTE — Patient Instructions (Addendum)
DUE TO COVID-19 ONLY ONE VISITOR IS ALLOWED TO COME WITH YOU AND STAY IN THE WAITING ROOM ONLY DURING PRE OP AND PROCEDURE DAY OF SURGERY. THE 1 VISITOR MAY VISIT WITH YOU AFTER SURGERY IN YOUR PRIVATE ROOM DURING VISITING HOURS ONLY!  YOU NEED TO HAVE A COVID 19 TEST ON___2/19/2021____ @___9 :10AM____, THIS TEST MUST BE DONE BEFORE SURGERY, COME  [address], [place] , [address].  ([name]) ONCE YOUR COVID TEST IS COMPLETED, PLEASE BEGIN THE QUARANTINE INSTRUCTIONS AS OUTLINED IN YOUR HANDOUT.                Drew Ochoa   Your procedure is scheduled on: 03/03/2019   Report to [facility] Main  Entrance    Report to [facility] at 5:30AM    Call this number if you have problems the morning of surgery [phone]    Bring CPAP mask and tubing day of surgery   Remember: NO SOLID FOOD AFTER MIDNIGHT THE NIGHT PRIOR TO SURGERY.     YOU MAY DRINK CLEAR LIQUIDS.   STOP CLEAR LIQUIDS AT ____4:30AM_____ AND THEN DRINK THE ENSURE PRE-SURGERY [place]. NOTHING BY MOUTH AFTER THE ENSURE DRINK!    CLEAR LIQUID DIET   Foods Allowed                                                                     Foods Excluded  Coffee and tea, regular and decaf                             liquids that you cannot  Plain Jell-O any favor except red or purple                                           see through such as: Fruit ices (not with fruit pulp)                                     milk, soups, orange juice  Iced Popsicles                                    All solid food Carbonated beverages, regular and diet                                    Cranberry, grape and apple juices Sports drinks like Gatorade Lightly seasoned clear broth or consume(fat free) Sugar, honey syrup  Sample Menu Breakfast                                Lunch                                     Supper Cranberry juice  Beef broth                            Chicken  broth Jell-O                                     Grape juice                           Apple juice Coffee or tea                        Jell-O                                      Popsicle                                                Coffee or tea                        Coffee or tea  _____________________________________________________________________    BRUSH YOUR TEETH MORNING OF SURGERY AND RINSE YOUR MOUTH OUT, NO CHEWING GUM CANDY OR MINTS.    Take these medicines the morning of surgery with A SIP OF WATER: none                  You may not have any metal on your body including body piercing and jewelry.    Do not wear lotions, powders or perfumes, deodorant                         Men may shave face and neck.   Do not bring valuables to the hospital. [place].   Contacts, dentures or bridgework may not be worn into surgery.   YOU MAY BRING A SMALL OVERNIGHT BAG   Patients discharged the day of surgery will not be allowed to drive home. IF YOU ARE HAVING SURGERY AND GOING HOME THE SAME DAY, YOU MUST HAVE AN ADULT TO DRIVE YOU HOME AND BE WITH YOU FOR 24 HOURS. YOU MAY GO HOME BY TAXI OR UBER OR ORTHERWISE, BUT AN ADULT MUST ACCOMPANY YOU HOME AND STAY WITH YOU FOR 24 HOURS.                Please read over the following fact sheets you were given: _____________________________________________________________________    [facility] - Preparing for Surgery Before surgery, you can play an important role.  Because skin is not sterile, your skin needs to be as free of germs as possible.  You can reduce the number of germs on your skin by washing with CHG (chlorahexidine gluconate) soap before surgery.  CHG is an antiseptic cleaner which kills germs and bonds with the skin to continue killing germs even after washing. Please DO NOT use if you have an allergy to CHG or antibacterial soaps.  If your skin becomes reddened/irritated stop  using the CHG and inform your nurse when you arrive  at Short Stay. Do not shave (including legs and underarms) for at least 48 hours prior to the first CHG shower.  You may shave your face/neck. Please follow these instructions carefully:  1.  Shower with CHG Soap the night before surgery and the  morning of Surgery.  2.  If you choose to wash your hair, wash your hair first as usual with your  normal  shampoo.  3.  After you shampoo, rinse your hair and body thoroughly to remove the  shampoo.                            4.  Use CHG as you would any other liquid soap.  You can apply chg directly  to the skin and wash                       Gently with a scrungie or clean washcloth.  5.  Apply the CHG Soap to your body ONLY FROM THE NECK DOWN.   Do not use on face/ open                           Wound or open sores. Avoid contact with eyes, ears mouth and genitals (private parts).                       Wash face,  Genitals (private parts) with your normal soap.             6.  Wash thoroughly, paying special attention to the area where your surgery  will be performed.  7.  Thoroughly rinse your body with warm water from the neck down.  8.  DO NOT shower/wash with your normal soap after using and rinsing off  the CHG Soap.                9.  Pat yourself dry with a clean towel.            10.  Wear clean pajamas.            11.  Place clean sheets on your bed the night of your first shower and do not  sleep with pets. Day of Surgery : Do not apply any lotions/deodorants the morning of surgery.  Please wear clean clothes to the hospital/surgery center.  FAILURE TO FOLLOW THESE INSTRUCTIONS MAY RESULT IN THE CANCELLATION OF YOUR SURGERY PATIENT SIGNATURE_________________________________  NURSE SIGNATURE__________________________________  ________________________________________________________________________   Drew Ochoa  An incentive spirometer is a tool that can help keep your  lungs clear and active. This tool measures how well you are filling your lungs with each breath. Taking long deep breaths may help reverse or decrease the chance of developing breathing (pulmonary) problems (especially infection) following:  A long period of time when you are unable to move or be active. BEFORE THE PROCEDURE   If the spirometer includes an indicator to show your best effort, your nurse or respiratory therapist will set it to a desired goal.  If possible, sit up straight or lean slightly forward. Try not to slouch.  Hold the incentive spirometer in an upright position. INSTRUCTIONS FOR USE  1. Sit on the edge of your bed if possible, or sit up as far as you can in bed or on a chair. 2. Hold the incentive spirometer in an upright position. 3. Breathe out normally. 4.  Place the mouthpiece in your mouth and seal your lips tightly around it. 5. Breathe in slowly and as deeply as possible, raising the piston or the ball toward the top of the column. 6. Hold your breath for 3-5 seconds or for as long as possible. Allow the piston or ball to fall to the bottom of the column. 7. Remove the mouthpiece from your mouth and breathe out normally. 8. Rest for a few seconds and repeat Steps 1 through 7 at least 10 times every 1-2 hours when you are awake. Take your time and take a few normal breaths between deep breaths. 9. The spirometer may include an indicator to show your best effort. Use the indicator as a goal to work toward during each repetition. 10. After each set of 10 deep breaths, practice coughing to be sure your lungs are clear. If you have an incision (the cut made at the time of surgery), support your incision when coughing by placing a pillow or rolled up towels firmly against it. Once you are able to get out of bed, walk around indoors and cough well. You may stop using the incentive spirometer when instructed by your caregiver.  RISKS AND COMPLICATIONS  Take your time so  you do not get dizzy or light-headed.  If you are in pain, you may need to take or ask for pain medication before doing incentive spirometry. It is harder to take a deep breath if you are having pain. AFTER USE  Rest and breathe slowly and easily.  It can be helpful to keep track of a log of your progress. Your caregiver can provide you with a simple table to help with this. If you are using the spirometer at home, follow these instructions: [place] IF:   You are having difficultly using the spirometer.  You have trouble using the spirometer as often as instructed.  Your pain medication is not giving enough relief while using the spirometer.  You develop fever of 100.5 F (38.1 C) or higher. SEEK IMMEDIATE MEDICAL CARE IF:   You cough up bloody sputum that had not been present before.  You develop fever of 102 F (38.9 C) or greater.  You develop worsening pain at or near the incision site. MAKE SURE YOU:   Understand these instructions.  Will watch your condition.  Will get help right away if you are not doing well or get worse. Document Released: 05/07/2006 Document Revised: 03/19/2011 Document Reviewed: 07/08/2006 [facility] Patient Information 2014 [place], [place].   ________________________________________________________________________

## 2019-02-23 ENCOUNTER — Encounter (HOSPITAL_COMMUNITY)
Admission: RE | Admit: 2019-02-23 | Discharge: 2019-02-23 | Disposition: A | Discharge status: Home / Self Care | Payer: 59 | Source: Ambulatory Visit | Attending: Orthopedic Surgery | Admitting: Orthopedic Surgery

## 2019-02-23 ENCOUNTER — Other Ambulatory Visit: Payer: Self-pay

## 2019-02-23 ENCOUNTER — Encounter (HOSPITAL_COMMUNITY): Payer: Self-pay

## 2019-02-23 DIAGNOSIS — Z01812 Encounter for preprocedural laboratory examination: Secondary | ICD-10-CM | POA: Diagnosis present

## 2019-02-23 DIAGNOSIS — G4733 Obstructive sleep apnea (adult) (pediatric): Secondary | ICD-10-CM | POA: Insufficient documentation

## 2019-02-23 HISTORY — DX: Personal history of urinary calculi: Z87.442

## 2019-02-23 HISTORY — DX: Obstructive sleep apnea (adult) (pediatric): G47.33

## 2019-02-23 HISTORY — DX: Unspecified osteoarthritis, unspecified site: M19.90

## 2019-02-23 HISTORY — DX: Unspecified malignant neoplasm of skin, unspecified: C44.90

## 2019-02-23 LAB — CBC
HCT: 39.9 % (ref 39.0–52.0)
Hemoglobin: 12.4 g/dL — ABNORMAL LOW (ref 13.0–17.0)
MCH: 25.6 pg — ABNORMAL LOW (ref 26.0–34.0)
MCHC: 31.1 g/dL (ref 30.0–36.0)
MCV: 82.3 fL (ref 80.0–100.0)
Platelets: 300 10*3/uL (ref 150–400)
RBC: 4.85 MIL/uL (ref 4.22–5.81)
RDW: 14.3 % (ref 11.5–15.5)
WBC: 5.8 10*3/uL (ref 4.0–10.5)
nRBC: 0 % (ref 0.0–0.2)

## 2019-02-23 LAB — BASIC METABOLIC PANEL
Anion gap: 9 (ref 5–15)
BUN: 14 mg/dL (ref 6–20)
CO2: 27 mmol/L (ref 22–32)
Calcium: 9.3 mg/dL (ref 8.9–10.3)
Chloride: 106 mmol/L (ref 98–111)
Creatinine, Ser: 1.21 mg/dL (ref 0.61–1.24)
GFR calc Af Amer: >60 mL/min (ref >60)
GFR calc non Af Amer: >60 mL/min (ref >60)
Glucose, Bld: 91 mg/dL (ref 70–99)
Potassium: 4.3 mmol/L (ref 3.5–5.1)
Sodium: 142 mmol/L (ref 135–145)

## 2019-02-23 LAB — SURGICAL PCR SCREEN
MRSA, PCR: NEGATIVE
Staphylococcus aureus: NEGATIVE

## 2019-02-23 NOTE — Progress Notes (Signed)
PCP -  Dr. Lenice Llamas last office visit 02/10/19 in epic Cardiologist - N/A  Chest x-ray - N/A EKG - N/A Stress Test - N/A ECHO - N/A Cardiac Cath - N/A  Sleep Study - 10/21/2014 in epic CPAP - Yes  Fasting Blood Sugar - N/A Checks Blood Sugar _N/A____ times a day  Blood Thinner Instructions: N/A Aspirin Instructions: Yes Last Dose: 02/16/2019  Anesthesia review: OSA CPAP  Patient denies shortness of breath, fever, cough and chest pain at PAT appointment   Patient verbalized understanding of instructions that were given to them at the PAT appointment. Patient was also instructed that they will need to review over the PAT instructions again at home before surgery.

## 2019-02-27 ENCOUNTER — Other Ambulatory Visit (HOSPITAL_COMMUNITY)
Admission: RE | Admit: 2019-02-27 | Discharge: 2019-02-27 | Disposition: A | Discharge status: Home / Self Care | Payer: 59 | Source: Ambulatory Visit | Attending: Orthopedic Surgery | Admitting: Orthopedic Surgery

## 2019-02-27 DIAGNOSIS — Z01812 Encounter for preprocedural laboratory examination: Secondary | ICD-10-CM | POA: Diagnosis present

## 2019-02-27 DIAGNOSIS — Z20822 Contact with and (suspected) exposure to covid-19: Secondary | ICD-10-CM | POA: Insufficient documentation

## 2019-02-27 LAB — SARS CORONAVIRUS 2 (TAT 6-24 HRS): SARS Coronavirus 2: NEGATIVE

## 2019-03-02 ENCOUNTER — Telehealth: Payer: Self-pay | Admitting: Internal Medicine

## 2019-03-02 NOTE — Telephone Encounter (Signed)
Faxed office notes, labs and surgery clearance to [facility] Attn: Venida Jarvis [phone]

## 2019-03-03 ENCOUNTER — Inpatient Hospital Stay (HOSPITAL_COMMUNITY)
Admission: RE | Admit: 2019-03-03 | Discharge: 2019-03-04 | DRG: 468 | Disposition: A | Discharge status: Home / Self Care | Payer: 59 | Source: Other Acute Inpatient Hospital | Attending: Orthopedic Surgery | Admitting: Orthopedic Surgery

## 2019-03-03 ENCOUNTER — Other Ambulatory Visit: Payer: Self-pay

## 2019-03-03 ENCOUNTER — Observation Stay (HOSPITAL_COMMUNITY): Payer: 59

## 2019-03-03 ENCOUNTER — Encounter (HOSPITAL_COMMUNITY): Payer: Self-pay | Admitting: Orthopedic Surgery

## 2019-03-03 ENCOUNTER — Encounter (HOSPITAL_COMMUNITY)
Admission: RE | Disposition: A | Discharge status: Home / Self Care | Payer: Self-pay | Source: Other Acute Inpatient Hospital | Attending: Orthopedic Surgery

## 2019-03-03 ENCOUNTER — Inpatient Hospital Stay (HOSPITAL_COMMUNITY): Payer: 59 | Admitting: Anesthesiology

## 2019-03-03 ENCOUNTER — Inpatient Hospital Stay (HOSPITAL_COMMUNITY): Payer: 59 | Admitting: Physician Assistant

## 2019-03-03 DIAGNOSIS — T84092A Other mechanical complication of internal right knee prosthesis, initial encounter: Secondary | ICD-10-CM | POA: Diagnosis present

## 2019-03-03 DIAGNOSIS — Z6828 Body mass index (BMI) 28.0-28.9, adult: Secondary | ICD-10-CM | POA: Diagnosis not present

## 2019-03-03 DIAGNOSIS — Z79899 Other long term (current) drug therapy: Secondary | ICD-10-CM

## 2019-03-03 DIAGNOSIS — D509 Iron deficiency anemia, unspecified: Secondary | ICD-10-CM | POA: Diagnosis present

## 2019-03-03 DIAGNOSIS — G4733 Obstructive sleep apnea (adult) (pediatric): Secondary | ICD-10-CM | POA: Diagnosis present

## 2019-03-03 DIAGNOSIS — E785 Hyperlipidemia, unspecified: Secondary | ICD-10-CM | POA: Diagnosis present

## 2019-03-03 DIAGNOSIS — Z20822 Contact with and (suspected) exposure to covid-19: Secondary | ICD-10-CM | POA: Diagnosis present

## 2019-03-03 DIAGNOSIS — E669 Obesity, unspecified: Secondary | ICD-10-CM | POA: Diagnosis present

## 2019-03-03 DIAGNOSIS — Y792 Prosthetic and other implants, materials and accessory orthopedic devices associated with adverse incidents: Secondary | ICD-10-CM | POA: Diagnosis present

## 2019-03-03 DIAGNOSIS — T84012A Broken internal right knee prosthesis, initial encounter: Secondary | ICD-10-CM

## 2019-03-03 DIAGNOSIS — Z96659 Presence of unspecified artificial knee joint: Secondary | ICD-10-CM

## 2019-03-03 DIAGNOSIS — Z96651 Presence of right artificial knee joint: Secondary | ICD-10-CM

## 2019-03-03 HISTORY — PX: TOTAL KNEE ARTHROPLASTY: SHX125

## 2019-03-03 SURGERY — ARTHROPLASTY, KNEE, TOTAL
Anesthesia: Spinal | Site: Knee | Laterality: Right

## 2019-03-03 MED ORDER — OXYCODONE HCL 5 MG PO TABS: 5.0000 mg | ORAL_TABLET | ORAL | 0 refills | Status: DC | PRN

## 2019-03-03 MED ORDER — POVIDONE-IODINE 10 % EX SWAB
2.0000 "application " | Freq: Once | CUTANEOUS | Status: AC
  Administered 2019-03-03: 2 via TOPICAL

## 2019-03-03 MED ORDER — FENTANYL CITRATE (PF) 100 MCG/2ML IJ SOLN
INTRAMUSCULAR | Status: DC | PRN
  Administered 2019-03-03 (×2): 50 ug via INTRAVENOUS

## 2019-03-03 MED ORDER — MAGNESIUM CITRATE PO SOLN: 1.0000 | Freq: Once | ORAL | Status: DC | PRN

## 2019-03-03 MED ORDER — PROPOFOL 500 MG/50ML IV EMUL
INTRAVENOUS | Status: AC
  Filled 2019-03-03: qty 50

## 2019-03-03 MED ORDER — OXYCODONE HCL 5 MG PO TABS
10.0000 mg | ORAL_TABLET | ORAL | Status: DC | PRN
  Filled 2019-03-03: qty 2

## 2019-03-03 MED ORDER — ZOLPIDEM TARTRATE 5 MG PO TABS: 5.0000 mg | ORAL_TABLET | Freq: Every evening | ORAL | Status: DC | PRN

## 2019-03-03 MED ORDER — SENNA-DOCUSATE SODIUM 8.6-50 MG PO TABS: 2.0000 | ORAL_TABLET | Freq: Every day | ORAL | 1 refills | Status: DC

## 2019-03-03 MED ORDER — BACLOFEN 10 MG PO TABS: 10.0000 mg | ORAL_TABLET | Freq: Three times a day (TID) | ORAL | 0 refills | Status: DC

## 2019-03-03 MED ORDER — METHOCARBAMOL 500 MG PO TABS
500.0000 mg | ORAL_TABLET | Freq: Four times a day (QID) | ORAL | Status: DC | PRN
  Administered 2019-03-03 – 2019-03-04 (×3): 500 mg via ORAL
  Filled 2019-03-03 (×3): qty 1

## 2019-03-03 MED ORDER — TRANEXAMIC ACID-NACL 1000-0.7 MG/100ML-% IV SOLN
1000.0000 mg | INTRAVENOUS | Status: AC
  Administered 2019-03-03: 1000 mg via INTRAVENOUS
  Filled 2019-03-03: qty 100

## 2019-03-03 MED ORDER — WATER FOR IRRIGATION, STERILE IR SOLN
Status: DC | PRN
  Administered 2019-03-03: 1000 mL

## 2019-03-03 MED ORDER — ONDANSETRON HCL 4 MG PO TABS: 4.0000 mg | ORAL_TABLET | Freq: Four times a day (QID) | ORAL | Status: DC | PRN

## 2019-03-03 MED ORDER — PROPOFOL 10 MG/ML IV BOLUS
INTRAVENOUS | Status: DC | PRN
  Administered 2019-03-03 (×2): 20 mg via INTRAVENOUS

## 2019-03-03 MED ORDER — BUPIVACAINE HCL 0.25 % IJ SOLN
INTRAMUSCULAR | Status: DC | PRN
  Administered 2019-03-03: 30 mL

## 2019-03-03 MED ORDER — 0.9 % SODIUM CHLORIDE (POUR BTL) OPTIME
TOPICAL | Status: DC | PRN
  Administered 2019-03-03: 1000 mL

## 2019-03-03 MED ORDER — MENTHOL 3 MG MT LOZG: 1.0000 | LOZENGE | OROMUCOSAL | Status: DC | PRN

## 2019-03-03 MED ORDER — ONDANSETRON HCL 4 MG/2ML IJ SOLN
INTRAMUSCULAR | Status: DC | PRN
  Administered 2019-03-03: 4 mg via INTRAVENOUS

## 2019-03-03 MED ORDER — OXYCODONE HCL 5 MG/5ML PO SOLN: 5.0000 mg | Freq: Once | ORAL | Status: DC | PRN

## 2019-03-03 MED ORDER — FENTANYL CITRATE (PF) 100 MCG/2ML IJ SOLN
25.0000 ug | INTRAMUSCULAR | Status: DC | PRN
  Administered 2019-03-03 (×2): 50 ug via INTRAVENOUS

## 2019-03-03 MED ORDER — ACETAMINOPHEN 500 MG PO TABS
1000.0000 mg | ORAL_TABLET | Freq: Once | ORAL | Status: AC
  Administered 2019-03-03: 06:00:00 1000 mg via ORAL
  Filled 2019-03-03: qty 2

## 2019-03-03 MED ORDER — METHOCARBAMOL 500 MG IVPB - SIMPLE MED
500.0000 mg | Freq: Four times a day (QID) | INTRAVENOUS | Status: DC | PRN
  Filled 2019-03-03: qty 50

## 2019-03-03 MED ORDER — PHENYLEPHRINE HCL-NACL 10-0.9 MG/250ML-% IV SOLN
INTRAVENOUS | Status: DC | PRN
  Administered 2019-03-03: 25 ug/min via INTRAVENOUS

## 2019-03-03 MED ORDER — BUPIVACAINE IN DEXTROSE 0.75-8.25 % IT SOLN
INTRATHECAL | Status: DC | PRN
  Administered 2019-03-03: 2 mL via INTRATHECAL

## 2019-03-03 MED ORDER — PROPOFOL 1000 MG/100ML IV EMUL
INTRAVENOUS | Status: AC
  Filled 2019-03-03: qty 100

## 2019-03-03 MED ORDER — ROPIVACAINE HCL 7.5 MG/ML IJ SOLN
INTRAMUSCULAR | Status: DC | PRN
  Administered 2019-03-03: 20 mL via PERINEURAL

## 2019-03-03 MED ORDER — ASPIRIN EC 325 MG PO TBEC
325.0000 mg | DELAYED_RELEASE_TABLET | Freq: Every day | ORAL | Status: DC
  Administered 2019-03-04: 325 mg via ORAL
  Filled 2019-03-03: qty 1

## 2019-03-03 MED ORDER — METOCLOPRAMIDE HCL 5 MG PO TABS: 5.0000 mg | ORAL_TABLET | Freq: Three times a day (TID) | ORAL | Status: DC | PRN

## 2019-03-03 MED ORDER — SODIUM CHLORIDE 0.9 % IR SOLN
Status: DC | PRN
  Administered 2019-03-03: 3000 mL

## 2019-03-03 MED ORDER — KETOROLAC TROMETHAMINE 15 MG/ML IJ SOLN
7.5000 mg | Freq: Four times a day (QID) | INTRAMUSCULAR | Status: AC
  Administered 2019-03-03 – 2019-03-04 (×4): 7.5 mg via INTRAVENOUS
  Filled 2019-03-03 (×4): qty 1

## 2019-03-03 MED ORDER — POTASSIUM CHLORIDE IN NACL 20-0.45 MEQ/L-% IV SOLN
INTRAVENOUS | Status: DC
  Filled 2019-03-03 (×3): qty 1000

## 2019-03-03 MED ORDER — PHENOL 1.4 % MT LIQD: 1.0000 | OROMUCOSAL | Status: DC | PRN

## 2019-03-03 MED ORDER — BUPIVACAINE HCL (PF) 0.25 % IJ SOLN
INTRAMUSCULAR | Status: AC
  Filled 2019-03-03: qty 30

## 2019-03-03 MED ORDER — DOCUSATE SODIUM 100 MG PO CAPS
100.0000 mg | ORAL_CAPSULE | Freq: Two times a day (BID) | ORAL | Status: DC
  Administered 2019-03-03 – 2019-03-04 (×2): 100 mg via ORAL
  Filled 2019-03-03 (×2): qty 1

## 2019-03-03 MED ORDER — ONDANSETRON HCL 4 MG/2ML IJ SOLN: 4.0000 mg | Freq: Four times a day (QID) | INTRAMUSCULAR | Status: DC | PRN

## 2019-03-03 MED ORDER — ROSUVASTATIN CALCIUM 5 MG PO TABS
5.0000 mg | ORAL_TABLET | ORAL | Status: DC
  Administered 2019-03-04: 10:00:00 5 mg via ORAL
  Filled 2019-03-03: qty 1

## 2019-03-03 MED ORDER — CHLORHEXIDINE GLUCONATE 4 % EX LIQD: 60.0000 mL | Freq: Once | CUTANEOUS | Status: DC

## 2019-03-03 MED ORDER — CEFAZOLIN SODIUM-DEXTROSE 2-4 GM/100ML-% IV SOLN
2.0000 g | Freq: Four times a day (QID) | INTRAVENOUS | Status: AC
  Administered 2019-03-03 (×2): 2 g via INTRAVENOUS
  Filled 2019-03-03 (×3): qty 100

## 2019-03-03 MED ORDER — DIPHENHYDRAMINE HCL 12.5 MG/5ML PO ELIX: 12.5000 mg | ORAL_SOLUTION | ORAL | Status: DC | PRN

## 2019-03-03 MED ORDER — PHENYLEPHRINE HCL (PRESSORS) 10 MG/ML IV SOLN
INTRAVENOUS | Status: AC
  Filled 2019-03-03: qty 1

## 2019-03-03 MED ORDER — DEXAMETHASONE SODIUM PHOSPHATE 10 MG/ML IJ SOLN
10.0000 mg | Freq: Once | INTRAMUSCULAR | Status: AC
  Administered 2019-03-04: 10 mg via INTRAVENOUS
  Filled 2019-03-03: qty 1

## 2019-03-03 MED ORDER — PROPOFOL 500 MG/50ML IV EMUL
INTRAVENOUS | Status: DC | PRN
  Administered 2019-03-03: 110 ug/kg/min via INTRAVENOUS

## 2019-03-03 MED ORDER — ASPIRIN EC 325 MG PO TBEC: 325.0000 mg | DELAYED_RELEASE_TABLET | Freq: Two times a day (BID) | ORAL | 0 refills | Status: DC

## 2019-03-03 MED ORDER — DEXAMETHASONE SODIUM PHOSPHATE 10 MG/ML IJ SOLN
INTRAMUSCULAR | Status: AC
  Filled 2019-03-03: qty 1

## 2019-03-03 MED ORDER — METOCLOPRAMIDE HCL 5 MG/ML IJ SOLN: 5.0000 mg | Freq: Three times a day (TID) | INTRAMUSCULAR | Status: DC | PRN

## 2019-03-03 MED ORDER — ONDANSETRON HCL 4 MG PO TABS: 4.0000 mg | ORAL_TABLET | Freq: Three times a day (TID) | ORAL | 0 refills | Status: DC | PRN

## 2019-03-03 MED ORDER — TRANEXAMIC ACID-NACL 1000-0.7 MG/100ML-% IV SOLN
1000.0000 mg | Freq: Once | INTRAVENOUS | Status: AC
  Administered 2019-03-03: 13:00:00 1000 mg via INTRAVENOUS
  Filled 2019-03-03: qty 100

## 2019-03-03 MED ORDER — KETOROLAC TROMETHAMINE 30 MG/ML IJ SOLN
INTRAMUSCULAR | Status: DC | PRN
  Administered 2019-03-03: 30 mg via INTRAVENOUS

## 2019-03-03 MED ORDER — PHENYLEPHRINE 40 MCG/ML (10ML) SYRINGE FOR IV PUSH (FOR BLOOD PRESSURE SUPPORT)
PREFILLED_SYRINGE | INTRAVENOUS | Status: AC
  Filled 2019-03-03: qty 10

## 2019-03-03 MED ORDER — LACTATED RINGERS IV SOLN: INTRAVENOUS | Status: DC

## 2019-03-03 MED ORDER — ACETAMINOPHEN 500 MG PO TABS
1000.0000 mg | ORAL_TABLET | Freq: Four times a day (QID) | ORAL | Status: AC
  Administered 2019-03-03 – 2019-03-04 (×4): 1000 mg via ORAL
  Filled 2019-03-03 (×4): qty 2

## 2019-03-03 MED ORDER — MIDAZOLAM HCL 2 MG/2ML IJ SOLN
INTRAMUSCULAR | Status: AC
  Filled 2019-03-03: qty 2

## 2019-03-03 MED ORDER — ONDANSETRON HCL 4 MG/2ML IJ SOLN
INTRAMUSCULAR | Status: AC
  Filled 2019-03-03: qty 2

## 2019-03-03 MED ORDER — POLYETHYLENE GLYCOL 3350 17 G PO PACK: 17.0000 g | PACK | Freq: Every day | ORAL | Status: DC | PRN

## 2019-03-03 MED ORDER — BISACODYL 10 MG RE SUPP: 10.0000 mg | Freq: Every day | RECTAL | Status: DC | PRN

## 2019-03-03 MED ORDER — DEXAMETHASONE SODIUM PHOSPHATE 10 MG/ML IJ SOLN
INTRAMUSCULAR | Status: DC | PRN
  Administered 2019-03-03: 10 mg via INTRAVENOUS

## 2019-03-03 MED ORDER — MIDAZOLAM HCL 5 MG/5ML IJ SOLN
INTRAMUSCULAR | Status: DC | PRN
  Administered 2019-03-03: 1 mg via INTRAVENOUS

## 2019-03-03 MED ORDER — KETOROLAC TROMETHAMINE 30 MG/ML IJ SOLN
INTRAMUSCULAR | Status: AC
  Filled 2019-03-03: qty 1

## 2019-03-03 MED ORDER — OXYCODONE HCL 5 MG PO TABS: 5.0000 mg | ORAL_TABLET | Freq: Once | ORAL | Status: DC | PRN

## 2019-03-03 MED ORDER — FENTANYL CITRATE (PF) 100 MCG/2ML IJ SOLN
INTRAMUSCULAR | Status: AC
  Filled 2019-03-03: qty 2

## 2019-03-03 MED ORDER — PROPOFOL 10 MG/ML IV BOLUS
INTRAVENOUS | Status: AC
  Filled 2019-03-03: qty 20

## 2019-03-03 MED ORDER — OXYCODONE HCL 5 MG PO TABS
5.0000 mg | ORAL_TABLET | ORAL | Status: DC | PRN
  Administered 2019-03-03 – 2019-03-04 (×2): 10 mg via ORAL
  Filled 2019-03-03: qty 2

## 2019-03-03 MED ORDER — ALUM & MAG HYDROXIDE-SIMETH 200-200-20 MG/5ML PO SUSP: 30.0000 mL | ORAL | Status: DC | PRN

## 2019-03-03 MED ORDER — EPHEDRINE 5 MG/ML INJ
INTRAVENOUS | Status: AC
  Filled 2019-03-03: qty 10

## 2019-03-03 MED ORDER — HYDROMORPHONE HCL 1 MG/ML IJ SOLN: 0.5000 mg | INTRAMUSCULAR | Status: DC | PRN

## 2019-03-03 MED ORDER — ACETAMINOPHEN 325 MG PO TABS: 325.0000 mg | ORAL_TABLET | Freq: Four times a day (QID) | ORAL | Status: DC | PRN

## 2019-03-03 MED ORDER — CEFAZOLIN SODIUM-DEXTROSE 2-4 GM/100ML-% IV SOLN
2.0000 g | INTRAVENOUS | Status: AC
  Administered 2019-03-03: 2 g via INTRAVENOUS
  Filled 2019-03-03: qty 100

## 2019-03-03 SURGICAL SUPPLY — 56 items
BAG ZIPLOCK 12X15 (MISCELLANEOUS) IMPLANT
BIT DRILL QUICK REL 1/8 2PK SL (DRILL) IMPLANT
BLADE SAG 18X100X1.27 (BLADE) ×6 IMPLANT
BLADE SURG 15 STRL LF DISP TIS (BLADE) ×1 IMPLANT
BLADE SURG 15 STRL SS (BLADE) ×2
BNDG ELASTIC 6X10 VLCR STRL LF (GAUZE/BANDAGES/DRESSINGS) ×3 IMPLANT
BOWL SMART MIX CTS (DISPOSABLE) ×3 IMPLANT
CEMENT BONE R 1X40 (Cement) ×6 IMPLANT
CLOSURE STERI-STRIP 1/2X4 (GAUZE/BANDAGES/DRESSINGS) ×1
CLSR STERI-STRIP ANTIMIC 1/2X4 (GAUZE/BANDAGES/DRESSINGS) ×3 IMPLANT
COMP FEM VG IL 70 RT (Knees) ×3 IMPLANT
COMPONENT FEM VG IL 70 RT (Knees) IMPLANT
COVER SURGICAL LIGHT HANDLE (MISCELLANEOUS) ×3 IMPLANT
COVER WAND RF STERILE (DRAPES) IMPLANT
CUFF TOURN SGL QUICK 34 (TOURNIQUET CUFF) ×2
CUFF TRNQT CYL 34X4.125X (TOURNIQUET CUFF) ×1 IMPLANT
DECANTER SPIKE VIAL GLASS SM (MISCELLANEOUS) IMPLANT
DISTAL FEMORAL PEG (Knees) ×3 IMPLANT
DRAPE U-SHAPE 47X51 STRL (DRAPES) ×3 IMPLANT
DRILL QUICK RELEASE 1/8 INCH (DRILL) ×4
DRSG MEPILEX BORDER 4X8 (GAUZE/BANDAGES/DRESSINGS) ×3 IMPLANT
DRSG MEPILEX SACRM 8.7X9.8 (GAUZE/BANDAGES/DRESSINGS) ×2 IMPLANT
DRSG PAD ABDOMINAL 8X10 ST (GAUZE/BANDAGES/DRESSINGS) ×3 IMPLANT
DURAPREP 26ML APPLICATOR (WOUND CARE) ×6 IMPLANT
ELECT REM PT RETURN 15FT ADLT (MISCELLANEOUS) ×3 IMPLANT
GLOVE BIO SURGEON STRL SZ7 (GLOVE) ×3 IMPLANT
GLOVE BIOGEL PI IND STRL 7.0 (GLOVE) ×1 IMPLANT
GLOVE BIOGEL PI IND STRL 8 (GLOVE) ×1 IMPLANT
GLOVE BIOGEL PI INDICATOR 7.0 (GLOVE) ×2
GLOVE BIOGEL PI INDICATOR 8 (GLOVE) ×2
GLOVE SURG SS PI 7.5 STRL IVOR (GLOVE) ×3 IMPLANT
GOWN STRL REUS W/TWL LRG LVL3 (GOWN DISPOSABLE) ×6 IMPLANT
HANDPIECE INTERPULSE COAX TIP (DISPOSABLE) ×2
HOLDER FOLEY CATH W/STRAP (MISCELLANEOUS) IMPLANT
HOOD PEEL AWAY FLYTE STAYCOOL (MISCELLANEOUS) ×9 IMPLANT
IMMOBILIZER KNEE 20 (SOFTGOODS) ×3
IMMOBILIZER KNEE 20 THIGH 36 (SOFTGOODS) ×1 IMPLANT
INSERT TIBIA BEAR 7/75 10 KNEE (Knees) ×2 IMPLANT
KIT TURNOVER KIT A (KITS) IMPLANT
MANIFOLD NEPTUNE II (INSTRUMENTS) ×3 IMPLANT
NS IRRIG 1000ML POUR BTL (IV SOLUTION) ×3 IMPLANT
PACK ICE MAXI GEL EZY WRAP (MISCELLANEOUS) ×3 IMPLANT
PACK TOTAL KNEE CUSTOM (KITS) ×3 IMPLANT
PEG FEMORAL DISTAL (Knees) IMPLANT
PEG PATELLA SERIES A 37MMX10MM (Orthopedic Implant) ×2 IMPLANT
PENCIL SMOKE EVACUATOR (MISCELLANEOUS) IMPLANT
PLATE KNEE TIBIAL 71MM FIXED (Plate) ×2 IMPLANT
PROTECTOR NERVE ULNAR (MISCELLANEOUS) ×3 IMPLANT
SET HNDPC FAN SPRY TIP SCT (DISPOSABLE) ×1 IMPLANT
SUT VIC AB 1 CT1 36 (SUTURE) ×6 IMPLANT
SUT VIC AB 2-0 CT1 27 (SUTURE) ×2
SUT VIC AB 2-0 CT1 TAPERPNT 27 (SUTURE) ×1 IMPLANT
SUT VIC AB 3-0 SH 8-18 (SUTURE) ×3 IMPLANT
TRAY FOLEY MTR SLVR 16FR STAT (SET/KITS/TRAYS/PACK) ×3 IMPLANT
WATER STERILE IRR 1000ML POUR (IV SOLUTION) ×6 IMPLANT
WRAP KNEE MAXI GEL POST OP (GAUZE/BANDAGES/DRESSINGS) ×2 IMPLANT

## 2019-03-03 NOTE — Anesthesia Procedure Notes (Signed)
Procedure Name: MAC Date/Time: 03/03/2019 7:30 AM Performed by: Lissa Morales, CRNA Pre-anesthesia Checklist: Patient identified, Emergency Drugs available, Suction available, Patient being monitored and Timeout performed Patient Re-evaluated:Patient Re-evaluated prior to induction Oxygen Delivery Method: Simple face mask Placement Confirmation: positive ETCO2

## 2019-03-03 NOTE — Anesthesia Procedure Notes (Signed)
Spinal  Patient location during procedure: OR End time: 03/03/2019 7:38 AM Staffing Performed: resident/CRNA  Resident/CRNA: Lissa Morales, CRNA Preanesthetic Checklist Completed: patient identified, IV checked, site marked, risks and benefits discussed, surgical consent, monitors and equipment checked, pre-op evaluation and timeout performed Spinal Block Patient position: sitting Prep: DuraPrep Patient monitoring: heart rate, continuous pulse ox, blood pressure and cardiac monitor Approach: midline Location: L3-4 Injection technique: single-shot Needle Needle type: Pencan  Needle gauge: 24 G Needle length: 9 cm Additional Notes Expiration date of kit checked and confirmed. Patient tolerated procedure well, without complications.

## 2019-03-03 NOTE — Evaluation (Signed)
Physical Therapy Evaluation Patient Details Name: Drew Ochoa MRN: [id number] DOB: [birth year] Today's Date: 03/03/2019   History of Present Illness  Patient is 54 y.o. male s/p Rt TKA after prior UKA. Patient with PMH significant for HLD, OA.  Clinical Impression  Drew Ochoa is a 54 y.o. male POD 0 s/p Rt TKA. Patient reports modified independence with walking pole for mobility for the last 2-3 yrs. Patient is now limited by functional impairments (see PT problem list below) and requires min assist for transfers and gait with RW. Patient was able to ambulate ~60 feet with RW and min assist. Patient instructed in exercise to facilitate ROM and circulation. Patient will benefit from continued skilled PT interventions to address impairments and progress towards PLOF. Acute PT will follow to progress mobility and stair training in preparation for safe discharge home.     Follow Up Recommendations Follow surgeon's recommendation for DC plan and follow-up therapies;Home health PT(pt wants HHPT initially and then OPPT)    Equipment Recommendations  Rolling walker with 5" wheels    Recommendations for Other Services       Precautions / Restrictions Precautions Precautions: Fall Restrictions Weight Bearing Restrictions: No      Mobility  Bed Mobility Overal bed mobility: Needs Assistance Bed Mobility: Supine to Sit     Supine to sit: Supervision;HOB elevated     General bed mobility comments: cues for use of bedn rail, pt required extra time  Transfers Overall transfer level: Needs assistance Equipment used: Rolling walker (2 wheeled) Transfers: Sit to/from Stand Sit to Stand: From elevated surface;Min assist         General transfer comment: cues for hand placement with RW and assist to initiate power up and complete rise.  Ambulation/Gait Ambulation/Gait assistance: Min assist Gait Distance (Feet): 60 Feet Assistive device: Rolling walker (2 wheeled) Gait  Pattern/deviations: Step-to pattern;Decreased weight shift to right;Decreased stance time - right;Decreased stride length;Decreased step length - left Gait velocity: decreased   General Gait Details: cues for safe step pattern with RW and to maintain safe proximty to RW. assist required initially to facilitate Rt knee extension in stance phase and verbal/tactile cues to unweight Rt LE with support on RW to prevent buckling.  Stairs            Wheelchair Mobility    Modified Rankin (Stroke Patients Only)       Balance Overall balance assessment: Needs assistance   Sitting balance-Leahy Scale: Good     Standing balance support: Bilateral upper extremity supported;During functional activity Standing balance-Leahy Scale: Poor            Pertinent Vitals/Pain Pain Assessment: 0-10 Pain Score: 6  Pain Location: Rt knee Pain Descriptors / Indicators: Aching Pain Intervention(s): Limited activity within patient's tolerance;Repositioned;Monitored during session;RN gave pain meds during session    Home Living Family/patient expects to be discharged to:: Private residence Living Arrangements: Spouse/significant other Available Help at Discharge: Family;Available 24 hours/day Type of Home: House Home Access: Stairs to enter Entrance Stairs-Rails: Right;Left Entrance Stairs-Number of Steps: 3  no rails, 4 with rt/Lt rails Home Layout: Two level;Able to live on main level with bedroom/bathroom Home Equipment: Kasandra Knudsen - single point;Shower seat Additional Comments: pt thinks he maybe has BSC    Prior Function Level of Independence: Independent with assistive device(s)         Comments: has been using walking stick for 2-51yrs; pt works full time but has been working from home since last March  Hand Dominance   Dominant Hand: Right    Extremity/Trunk Assessment   Upper Extremity Assessment Upper Extremity Assessment: Overall WFL for tasks assessed    Lower  Extremity Assessment Lower Extremity Assessment: RLE deficits/detail RLE Deficits / Details: good quad activation, no extensor lag with SLR RLE Sensation: WNL RLE Coordination: WNL    Cervical / Trunk Assessment Cervical / Trunk Assessment: Normal  Communication   Communication: No difficulties  Cognition Arousal/Alertness: Awake/alert Behavior During Therapy: WFL for tasks assessed/performed Overall Cognitive Status: Within Functional Limits for tasks assessed           General Comments      Exercises Total Joint Exercises Ankle Circles/Pumps: AROM;20 reps;Both;Seated Quad Sets: AROM;Right;5 reps;Seated Heel Slides: AROM;Right;Seated;5 reps   Assessment/Plan    PT Assessment Patient needs continued PT services  PT Problem List Decreased strength;Decreased mobility;Decreased range of motion;Decreased activity tolerance;Decreased balance;Decreased knowledge of use of DME       PT Treatment Interventions DME instruction;Therapeutic exercise;Balance training;Gait training;Stair training;Functional mobility training;Therapeutic activities;Patient/family education    PT Goals (Current goals can be found in the Care Plan section)  Acute Rehab PT Goals Patient Stated Goal: be able to go to a grasshoppers game again PT Goal Formulation: With patient Time For Goal Achievement: 03/10/19 Potential to Achieve Goals: Good    Frequency 7X/week    AM-PAC PT "6 Clicks" Mobility  Outcome Measure Help needed turning from your back to your side while in a flat bed without using bedrails?: A Little Help needed moving from lying on your back to sitting on the side of a flat bed without using bedrails?: A Little Help needed moving to and from a bed to a chair (including a wheelchair)?: A Little Help needed standing up from a chair using your arms (e.g., wheelchair or bedside chair)?: A Little Help needed to walk in hospital room?: A Little Help needed climbing 3-5 steps with a railing?  : A Little 6 Click Score: 18    End of Session Equipment Utilized During Treatment: Gait belt Activity Tolerance: Patient tolerated treatment well Patient left: with call bell/phone within reach;with chair alarm set;in chair Nurse Communication: Mobility status PT Visit Diagnosis: Unsteadiness on feet (R26.81);Difficulty in walking, not elsewhere classified (R26.2)    Time: [id number] PT Time Calculation (min) (ACUTE ONLY): 34 min   Charges:   PT Evaluation $PT Eval Low Complexity: 1 Low PT Treatments $Therapeutic Exercise: 8-22 mins       Verner Mould, DPT Physical Therapist with [facility] [phone]  03/03/2019 3:43 PM

## 2019-03-03 NOTE — Op Note (Signed)
DATE OF SURGERY:  03/03/2019 TIME: 10:19 AM  PATIENT NAME:  Drew Ochoa   AGE: 54 y.o.    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   OPERATIVE IMPLANTS: [employer]omet Vanguard Fixed Bearing Posterior Stabilized Femur size 70, Tibia size 71, Patella size 37 3-peg oval button, with a 10 mm polyethylene insert.   PREOPERATIVE INDICATIONS:  Drew Ochoa is a 54 y.o. year old male who had a right partial knee replacement about 6 years ago, and had excellent postoperative recovery and relief of pain, he was very active, hiking, and doing full activities, but who subsequently developed what appeared to be ACL deficiency, with tibiofemoral incongruity, who failed conservative treatment, including injections, antiinflammatories, activity modification, and assistive devices, and had significant impairment of their activities of daily living, and elected for Total Knee Arthroplasty.  Preoperative Synovasure was performed, and did not demonstrate any evidence for infection.  The risks, benefits, and alternatives were discussed at length including but not limited to the risks of infection, bleeding, nerve injury, stiffness, blood clots, the need for revision surgery, cardiopulmonary complications, among others, and they were willing to proceed.  OPERATIVE FINDINGS AND UNIQUE ASPECTS OF THE CASE: The ACL was completely dysfunctional and the tibiofemoral joint was incongruous, and he felt very unstable to ligamentous exam preoperatively.  I was able to remove the tibial and femoral  components without any bone loss, and use primary components for the conversion.  ESTIMATED BLOOD LOSS: 150 mL  OPERATIVE DESCRIPTION:  The patient was brought to the operative room and placed in a supine position.  Spinal anesthesia was administered.  IV antibiotics were given.  The lower extremity was prepped and draped in the usual sterile fashion.  Time out was performed.  The leg was elevated and exsanguinated and the tourniquet was inflated.  Anterior quadriceps tendon splitting approach was performed.  The patella was everted and osteophytes were removed.  The anterior horn of the lateral meniscus was removed.  The distal femur was opened with the drill and the intramedullary distal femoral cutting jig was utilized, set at 5 degrees resecting 9 mm off the distal femur.  Care was taken to protect the collateral ligaments.  I resected the lateral side, using the medial femoral component as a reference, and then removed the medial femoral component with an osteotome and was able to do so without significant bone loss.  I then cut the distal femur, on the medial side, I did not really get much bone, so I cut again 2 mm deeper, and the lateral cut was fairly thin to begin with so the extra 2 mm seemed appropriate.  Then the extramedullary tibial cutting jig was utilized making the appropriate cut using the anterior tibial crest as a reference building in appropriate posterior slope.  Care was taken during the cut to protect the medial and collateral ligaments.  After assembling the jig, and pinning the guidepin in place, I used an osteotome to remove the tibia, which came out smoothly.  I then cut the lateral side, and had a skim cut on the medial side.  The proximal tibia was removed along with the posterior  horns of the menisci.  The PCL was sacrificed.    The extensor gap was measured and was approximately 110mm.    The distal femoral sizing jig was applied, taking care to avoid notching.  I drew  [employer], and I also rotated the jig to 0, to correct for the deficient medial side.  Then the 4-in-1 cutting jig was applied and the anterior and posterior femur was cut, along with the chamfer cuts.  All posterior osteophytes were removed.  The flexion gap was then measured and was symmetric with the extension gap.  Interestingly, the medial cut was still reasonably sizable, although I was perfect in the rotation on the anterior flange, and my flexion gap was symmetric and balanced medially and laterally.  He was taller than he was wide, and I went with a 70, and I was perfectly flush anteriorly, although he slightly overhung laterally distally, although proximally he was exactly on.  I completed the distal femoral preparation using the appropriate jig to prepare the box.  The patella was then measured, and cut with the saw.  The thickness before the cut was 25 and after the cut was 16.5.  The proximal tibia sized and prepared accordingly with the reamer and the punch, and then all components were trialed with the 66mm poly insert.  The knee was found to have excellent balance and full motion.    The above named components were then cemented into place and all excess cement was removed.  The real polyethylene implant was placed.  After the cement had cured I released the tourniquet and confirmed excellent hemostasis with no major posterior vessel injury.    The knee was easily taken through a range of motion and the patella tracked well and the knee irrigated copiously and the parapatellar and subcutaneous tissue closed with vicryl, and monocryl with steri strips for the skin.  The wounds were injected with marcaine, and dressed with sterile gauze and the patient was awakened and returned to the PACU in stable and satisfactory condition.  There were no complications.  Total tourniquet time was 120 minutes.

## 2019-03-03 NOTE — Anesthesia Preprocedure Evaluation (Signed)
Anesthesia Evaluation  Patient identified by MRN, date of birth, ID band Patient awake    Reviewed: Allergy & Precautions, H&P , NPO status , Patient's Chart, lab work & pertinent test results  History of Anesthesia Complications (+) PROLONGED EMERGENCE  Airway Mallampati: II   Neck ROM: full    Dental   Pulmonary sleep apnea and Continuous Positive Airway Pressure Ventilation ,    breath sounds clear to auscultation       Cardiovascular negative cardio ROS   Rhythm:regular Rate:Normal     Neuro/Psych    GI/Hepatic   Endo/Other    Renal/GU stones     Musculoskeletal  (+) Arthritis ,   Abdominal   Peds  Hematology   Anesthesia Other Findings   Reproductive/Obstetrics                             Anesthesia Physical Anesthesia Plan  ASA: II  Anesthesia Plan: Spinal   Post-op Pain Management:  Regional for Post-op pain   Induction: Intravenous  PONV Risk Score and Plan: 1 and Ondansetron, Propofol infusion, Midazolam and Treatment may vary due to age or medical condition  Airway Management Planned: Simple Face Mask  Additional Equipment:   Intra-op Plan:   Post-operative Plan:   Informed Consent: I have reviewed the patients History and Physical, chart, labs and discussed the procedure including the risks, benefits and alternatives for the proposed anesthesia with the patient or authorized representative who has indicated his/her understanding and acceptance.       Plan Discussed with: CRNA, Anesthesiologist and Surgeon  Anesthesia Plan Comments:         Anesthesia Quick Evaluation

## 2019-03-03 NOTE — Discharge Instructions (Signed)
INSTRUCTIONS AFTER JOINT REPLACEMENT   o Remove items at home which could result in a fall. This includes throw rugs or furniture in walking pathways o ICE to the affected joint every three hours while awake for 30 minutes at a time, for at least the first 3-5 days, and then as needed for pain and swelling.  Continue to use ice for pain and swelling. You may notice swelling that will progress down to the foot and ankle.  This is normal after surgery.  Elevate your leg when you are not up walking on it.   o Continue to use the breathing machine you got in the hospital (incentive spirometer) which will help keep your temperature down.  It is common for your temperature to cycle up and down following surgery, especially at night when you are not up moving around and exerting yourself.  The breathing machine keeps your lungs expanded and your temperature down.   DIET:  As you were doing prior to hospitalization, we recommend a well-balanced diet.  DRESSING / WOUND CARE / SHOWERING  You may change your dressing 3-5 days after surgery.  Then change the dressing every day with sterile gauze.  Please use good hand washing techniques before changing the dressing.  Do not use any lotions or creams on the incision until instructed by your surgeon.  ACTIVITY  o Increase activity slowly as tolerated, but follow the weight bearing instructions below.   o No driving for 6 weeks or until further direction given by your physician.  You cannot drive while taking narcotics.  o No lifting or carrying greater than 10 lbs. until further directed by your surgeon. o Avoid periods of inactivity such as sitting longer than an hour when not asleep. This helps prevent blood clots.  o You may return to work once you are authorized by your doctor.     WEIGHT BEARING   Weight bearing as tolerated with assist device (walker, cane, etc) as directed, use it as long as suggested by your surgeon or therapist, typically at  least 4-6 weeks.   EXERCISES  Results after joint replacement surgery are often greatly improved when you follow the exercise, range of motion and muscle strengthening exercises prescribed by your doctor. Safety measures are also important to protect the joint from further injury. Any time any of these exercises cause you to have increased pain or swelling, decrease what you are doing until you are comfortable again and then slowly increase them. If you have problems or questions, call your caregiver or physical therapist for advice.   Rehabilitation is important following a joint replacement. After just a few days of immobilization, the muscles of the leg can become weakened and shrink (atrophy).  These exercises are designed to build up the tone and strength of the thigh and leg muscles and to improve motion. Often times heat used for twenty to thirty minutes before working out will loosen up your tissues and help with improving the range of motion but do not use heat for the first two weeks following surgery (sometimes heat can increase post-operative swelling).   These exercises can be done on a training (exercise) mat, on the floor, on a table or on a bed. Use whatever works the best and is most comfortable for you.    Use music or television while you are exercising so that the exercises are a pleasant break in your day. This will make your life better with the exercises acting as a break   in your routine that you can look forward to.   Perform all exercises about fifteen times, three times per day or as directed.  You should exercise both the operative leg and the other leg as well.  Exercises include:   . Quad Sets - Tighten up the muscle on the front of the thigh (Quad) and hold for 5-10 seconds.   . Straight Leg Raises - With your knee straight (if you were given a brace, keep it on), lift the leg to 60 degrees, hold for 3 seconds, and slowly lower the leg.  Perform this exercise against  resistance later as your leg gets stronger.  . Leg Slides: Lying on your back, slowly slide your foot toward your buttocks, bending your knee up off the floor (only go as far as is comfortable). Then slowly slide your foot back down until your leg is flat on the floor again.  . Angel Wings: Lying on your back spread your legs to the side as far apart as you can without causing discomfort.  . Hamstring Strength:  Lying on your back, push your heel against the floor with your leg straight by tightening up the muscles of your buttocks.  Repeat, but this time bend your knee to a comfortable angle, and push your heel against the floor.  You may put a pillow under the heel to make it more comfortable if necessary.   A rehabilitation program following joint replacement surgery can speed recovery and prevent re-injury in the future due to weakened muscles. Contact your doctor or a physical therapist for more information on knee rehabilitation.    CONSTIPATION  Constipation is defined medically as fewer than three stools per week and severe constipation as less than one stool per week.  Even if you have a regular bowel pattern at home, your normal regimen is likely to be disrupted due to multiple reasons following surgery.  Combination of anesthesia, postoperative narcotics, change in appetite and fluid intake all can affect your bowels.   YOU MUST use at least one of the following options; they are listed in order of increasing strength to get the job done.  They are all available over the counter, and you may need to use some, POSSIBLY even all of these options:    Drink plenty of fluids (prune juice may be helpful) and high fiber foods Colace 100 mg by mouth twice a day  Senokot for constipation as directed and as needed Dulcolax (bisacodyl), take with full glass of water  Miralax (polyethylene glycol) once or twice a day as needed.  If you have tried all these things and are unable to have a bowel  movement in the first 3-4 days after surgery call either your surgeon or your primary doctor.    If you experience loose stools or diarrhea, hold the medications until you stool forms back up.  If your symptoms do not get better within 1 week or if they get worse, check with your doctor.  If you experience "the worst abdominal pain ever" or develop nausea or vomiting, please contact the office immediately for further recommendations for treatment.   ITCHING:  If you experience itching with your medications, try taking only a single pain pill, or even half a pain pill at a time.  You can also use Benadryl over the counter for itching or also to help with sleep.   TED HOSE STOCKINGS:  Use stockings on both legs until for at least 2 weeks or as   directed by physician office. They may be removed at night for sleeping.  MEDICATIONS:  See your medication summary on the "After Visit Summary" that nursing will review with you.  You may have some home medications which will be placed on hold until you complete the course of blood thinner medication.  It is important for you to complete the blood thinner medication as prescribed.  PRECAUTIONS:  If you experience chest pain or shortness of breath - call 911 immediately for transfer to the hospital emergency department.   If you develop a fever greater that 101 F, purulent drainage from wound, increased redness or drainage from wound, foul odor from the wound/dressing, or calf pain - CONTACT YOUR SURGEON.                                                   FOLLOW-UP APPOINTMENTS:  If you do not already have a post-op appointment, please call the office for an appointment to be seen by your surgeon.  Guidelines for how soon to be seen are listed in your "After Visit Summary", but are typically between 1-4 weeks after surgery.  OTHER INSTRUCTIONS:   Knee Replacement:  Do not place pillow under knee, focus on keeping the knee straight while resting.   MAKE SURE  YOU:  . Understand these instructions.  . Get help right away if you are not doing well or get worse.    Thank you for letting us be a part of your medical care team.  It is a privilege we respect greatly.  We hope these instructions will help you stay on track for a fast and full recovery!

## 2019-03-03 NOTE — Anesthesia Procedure Notes (Signed)
Anesthesia Regional Block: Adductor canal block   Pre-Anesthetic Checklist: ,, timeout performed, Correct Patient, Correct Site, Correct Laterality, Correct Procedure, Correct Position, site marked, Risks and benefits discussed,  Surgical consent,  Pre-op evaluation,  At surgeon's request and post-op pain management  Laterality: Right  Prep: chloraprep       Needles:  Injection technique: Single-shot  Needle Type: Echogenic Needle     Needle Length: 9cm  Needle Gauge: 21     Additional Needles:   Narrative:  Start time: 03/03/2019 7:01 AM End time: 03/03/2019 7:10 AM Injection made incrementally with aspirations every 5 mL.  Performed by: Personally  Anesthesiologist: Albertha Ghee, MD  Additional Notes: Pt tolerated the procedure well.

## 2019-03-03 NOTE — Transfer of Care (Signed)
Immediate Anesthesia Transfer of Care Note  Patient: Drew Ochoa  Procedure(s) Performed: CONVERSION OF PARTIAL KNEE TO TOTAL KNEE ARTHROPLASTY (Right Knee)  Patient Location: PACU  Anesthesia Type:Regional and Spinal  Level of Consciousness: sedated  Airway & Oxygen Therapy: Patient Spontanous Breathing and Patient connected to face mask oxygen  Post-op Assessment: Report given to RN and Post -op Vital signs reviewed and stable  Post vital signs: Reviewed and stable  Last Vitals:  Vitals Value Taken Time  BP 129/114 03/03/19 1036  Temp    Pulse 84 03/03/19 1037  Resp 20 03/03/19 1037  SpO2 100 % 03/03/19 1037  Vitals shown include unvalidated device data.  Last Pain:  Vitals:   03/03/19 0600  TempSrc:   PainSc: 0-No pain         Complications: No apparent anesthesia complications

## 2019-03-03 NOTE — H&P (Signed)
PREOPERATIVE H&P  Chief Complaint: Right knee pain  HPI: Drew Ochoa is a 54 y.o. male who presents for preoperative history and physical with a diagnosis of right partial knee replacement. Symptoms are rated as moderate to severe, and have been worsening.  This is significantly impairing activities of daily living.  He has elected for surgical management.  He had a partial knee replacement done about 6 years ago, did very well, did a lot of hiking, in fact he hiked [place] but has recently developed increasing pain and recurrent swelling around the right knee.  X-rays of demonstrated subluxation with progressive wear of the rest of the knee with tibiofemoral incongruity.  He is elected for revision surgery.  Past Medical History:  Diagnosis Date  . Arthritis   . Blood in stool   . Bronchitis    hx of   . Colon polyps   . Complication of anesthesia    hard to wake up, "slept too long"  . Complication of anesthesia    problems urinating  . Hemorrhoids   . History of kidney stones   . Hyperlipidemia   . Iron deficiency anemia    history of  . OSA on CPAP   . Skin cancer    left shoulder  . Weakness    Past Surgical History:  Procedure Laterality Date  . BACK SURGERY  2008   cyst removal   . COLONOSCOPY    . ESOPHAGOGASTRODUODENOSCOPY ENDOSCOPY    . EYE SURGERY     cyst removal -eyelid 3rd grade  . KNEE SURGERY Right 2006   meniscal repair  . NOSE SURGERY     cyst removal   . PARTIAL KNEE ARTHROPLASTY Right   . SHOULDER ARTHROSCOPY WITH ROTATOR CUFF REPAIR AND SUBACROMIAL DECOMPRESSION Right 09/23/2012   Procedure: RIGHT SHOULDER ARTHROSCOPY WITH ROTATOR CUFF REPAIR AND SUBACROMIAL DECOMPRESSION AND DISTAL CLAVICLE RESECTION;  Surgeon: Cammie Sickle., MD;  Location: [place];  Service: Orthopedics;  Laterality: Right;  . TRANSANAL HEMORRHOIDAL DEARTERIALIZATION  02/01/2012   Procedure: TRANSANAL HEMORRHOIDAL DEARTERIALIZATION;  Surgeon: Leighton Ruff, MD;  Location: [facility];  Service: General;  Laterality: N/A;   Social History   Socioeconomic History  . Marital status: Married    Spouse name: Not on file  . Number of children: 0  . Years of education: Not on file  . Highest education level: Not on file  Occupational History  . Occupation: customs/import compliance    Employer: [employer]  Tobacco Use  . Smoking status: Never Smoker  . Smokeless tobacco: Never Used  . Tobacco comment: occ cigars rarely  Substance and Sexual Activity  . Alcohol use: Yes    Alcohol/week: 0.0 standard drinks    Comment: nightly  . Drug use: No  . Sexual activity: Not on file  Other Topics Concern  . Not on file  Social History Narrative  . Not on file   Social Determinants of Health   Financial Resource Strain:   . Difficulty of Paying Living Expenses: Not on file  Food Insecurity:   . Worried About Charity fundraiser in the Last Year: Not on file  . Ran Out of Food in the Last Year: Not on file  Transportation Needs:   . Lack of Transportation (Medical): Not on file  . Lack of Transportation (Non-Medical): Not on file  Physical Activity:   . Days of Exercise per Week: Not on file  . Minutes of Exercise per Session: Not  on file  Stress:   . Feeling of Stress : Not on file  Social Connections:   . Frequency of Communication with Friends and Family: Not on file  . Frequency of Social Gatherings with Friends and Family: Not on file  . Attends Religious Services: Not on file  . Active Member of Clubs or Organizations: Not on file  . Attends Archivist Meetings: Not on file  . Marital Status: Not on file   Family History  Problem Relation Age of Onset  . Mental illness Mother   . Skin cancer Mother   . Arthritis Father   . Depression Father   . Colon polyps Father   . Diabetes Maternal Uncle   . Alzheimer's disease Maternal Uncle    No Known Allergies Prior to Admission medications   Medication Sig Start  Date End Date Taking? Authorizing Provider  aspirin 325 MG tablet Take 650 mg by mouth every 6 (six) hours as needed for moderate pain.   Yes [provider]  rosuvastatin (CRESTOR) 5 MG tablet One po 3 times a week with supper Patient taking differently: Take 5 mg by mouth every Monday, Wednesday, and Friday. with supper 11/11/18  Yes Baxley, Cresenciano Lick, MD     Positive ROS: All other systems have been reviewed and were otherwise negative with the exception of those mentioned in the HPI and as above.  Physical Exam: General: Alert, no acute distress Cardiovascular: No pedal edema Respiratory: No cyanosis, no use of accessory musculature GI: No organomegaly, abdomen is soft and non-tender Skin: No lesions in the area of chief complaint Neurologic: Sensation intact distally Psychiatric: Patient is competent for consent with normal mood and affect Lymphatic: No axillary or cervical lymphadenopathy  MUSCULOSKELETAL: Right knee has well-healed surgical wounds with 0 to 125 degrees of motion with no redness or evidence for infection.  Previous aspiration of the right knee was negative for infection.  Assessment: Failed right partial knee replacement   Plan: Plan for Procedure(s): CONVERSION OF PARTIAL KNEE TO TOTAL KNEE ARTHROPLASTY WITH REVISION COMPONENTS  The risks benefits and alternatives were discussed with the patient including but not limited to the risks of nonoperative treatment, versus surgical intervention including infection, bleeding, nerve injury,  blood clots, cardiopulmonary complications, morbidity, mortality, among others, and they were willing to proceed.   Anticipated LOS equal to or greater than 2 midnights due to - Age 43 and older with one or more of the following:  - Obesity  - Expected need for hospital services (PT, OT, Nursing) required for safe  discharge  - Anticipated need for postoperative skilled nursing care or inpatient rehab  - Active  co-morbidities: Inpatient only procedure   Johnny Bridge, MD Cell [phone]   03/03/2019 7:21 AM

## 2019-03-04 ENCOUNTER — Encounter (HOSPITAL_COMMUNITY): Payer: Self-pay | Admitting: Orthopedic Surgery

## 2019-03-04 LAB — BASIC METABOLIC PANEL
Anion gap: 9 (ref 5–15)
BUN: 18 mg/dL (ref 6–20)
CO2: 22 mmol/L (ref 22–32)
Calcium: 8.7 mg/dL — ABNORMAL LOW (ref 8.9–10.3)
Chloride: 108 mmol/L (ref 98–111)
Creatinine, Ser: 1.02 mg/dL (ref 0.61–1.24)
GFR calc Af Amer: >60 mL/min (ref >60)
GFR calc non Af Amer: >60 mL/min (ref >60)
Glucose, Bld: 131 mg/dL — ABNORMAL HIGH (ref 70–99)
Potassium: 3.9 mmol/L (ref 3.5–5.1)
Sodium: 139 mmol/L (ref 135–145)

## 2019-03-04 LAB — CBC
HCT: 33.1 % — ABNORMAL LOW (ref 39.0–52.0)
Hemoglobin: 10.6 g/dL — ABNORMAL LOW (ref 13.0–17.0)
MCH: 26.2 pg (ref 26.0–34.0)
MCHC: 32 g/dL (ref 30.0–36.0)
MCV: 81.9 fL (ref 80.0–100.0)
Platelets: 199 10*3/uL (ref 150–400)
RBC: 4.04 MIL/uL — ABNORMAL LOW (ref 4.22–5.81)
RDW: 14.3 % (ref 11.5–15.5)
WBC: 14 10*3/uL — ABNORMAL HIGH (ref 4.0–10.5)
nRBC: 0 % (ref 0.0–0.2)

## 2019-03-04 NOTE — Progress Notes (Signed)
Subjective: 1 Day Post-Op s/p Procedure(s): CONVERSION OF PARTIAL KNEE TO TOTAL KNEE ARTHROPLASTY   Patient is alert, oriented, sitting up in med. Patient reports pain as moderate, describes sensation as "feeling like there is a board in my knee" Denies chest pain, SOB, Calf pain. No nausea/vomiting. No other complaints.   Objective:  PE: VITALS:   Vitals:   03/03/19 1453 03/03/19 2204 03/04/19 0222 03/04/19 0536  BP: 114/80 (!) 96/57 103/61 116/71  Pulse: 84 63 (!) 59 62  Resp: 17 16 16 15   Temp: 98.3 F (36.8 C) 98.3 F (36.8 C) 98.2 F (36.8 C) (!) 97.4 F (36.3 C)  TempSrc: Oral Oral Oral Oral  SpO2: 100% 98% 99% 100%  Weight:      Height:       General: In no acute distress, sitting comfortably in bed. Abdomen: Soft, non-tender MSK: RLE -  Neurovascular intact Sensation intact distally Intact pulses distally Dorsiflexion/Plantar flexion intact Incision: dressing C/D/I  LABS  Results for orders placed or performed during the hospital encounter of 03/03/19 (from the past 24 hour(s))  Aerobic/Anaerobic Culture (surgical/deep wound)     Status: None (Preliminary result)   Collection Time: 03/03/19  8:12 AM   Specimen: Synovial, Right Knee; Body Fluid  Result Value Ref Range   Specimen Description      TISSUE RT KNEE Performed at [facility] [address], [place], [place] [address]    Special Requests      PATIENT ON FOLLOWING ANCEF 2GM Performed at [facility], [place] [address] [place], [place] [address]    Gram Stain      RARE WBC PRESENT,BOTH PMN AND MONONUCLEAR NO ORGANISMS SEEN Performed at [facility], [place] [address] [place], [place] [address]    Culture PENDING    Report Status PENDING   CBC     Status: Abnormal   Collection Time: 03/04/19  4:59 AM  Result Value Ref Range   WBC 14.0 (H) 4.0 - 10.5 K/uL   RBC 4.04 (L) 4.22 - 5.81 MIL/uL   Hemoglobin 10.6 (L) 13.0 - 17.0 g/dL   HCT 33.1 (L)  39.0 - 52.0 %   MCV 81.9 80.0 - 100.0 fL   MCH 26.2 26.0 - 34.0 pg   MCHC 32.0 30.0 - 36.0 g/dL   RDW 14.3 11.5 - 15.5 %   Platelets 199 150 - 400 K/uL   nRBC 0.0 0.0 - 0.2 %  Basic metabolic panel     Status: Abnormal   Collection Time: 03/04/19  4:59 AM  Result Value Ref Range   Sodium 139 135 - 145 mmol/L   Potassium 3.9 3.5 - 5.1 mmol/L   Chloride 108 98 - 111 mmol/L   CO2 22 22 - 32 mmol/L   Glucose, Bld 131 (H) 70 - 99 mg/dL   BUN 18 6 - 20 mg/dL   Creatinine, Ser 1.02 0.61 - 1.24 mg/dL   Calcium 8.7 (L) 8.9 - 10.3 mg/dL   GFR calc non Af Amer >60 >60 mL/min   GFR calc Af Amer >60 >60 mL/min   Anion gap 9 5 - 15    DG Knee Right Port  Result Date: 03/03/2019 CLINICAL DATA:  Post RIGHT total knee replacement EXAM: PORTABLE RIGHT KNEE - 1-2 VIEW COMPARISON:  01/16/2019 FINDINGS: Components of a RIGHT total knee arthroplasty are identified in expected position. No fracture, dislocation, or bone destruction. Small amount of fluid and gas within the  knee joint. IMPRESSION: Post RIGHT total knee arthroplasty. No acute abnormalities. Electronically Signed   By: Lavonia Dana M.D.   On: 03/03/2019 13:46    Assessment/Plan: Principal Problem:   Failed partial right knee replacement (HCC) Active Problems:   S/P TKR (total knee replacement) using cement, right   S/P revision of total knee    1 Day Post-Op s/p Procedure(s): CONVERSION OF PARTIAL KNEE TO TOTAL KNEE ARTHROPLASTY  Weightbearing: WBAT RLE, up with therapy Insicional and dressing care: PRN dressing changes VTE prophylaxis: Aspirin 325mg  BID x 1 month Pain control: continue PRN pain regimen Follow - up plan: Follow-up with Dr. Mardelle Matte in 2 weeks.   Patient indicated need for HHPT. Will add in Face to Face consult for HHPT.   Contact information:   Weekdays 8-5 Merlene [address]ing, [place] [phone] A fter hours and holidays please check [email or web address] for group call information for [facility]  Ventura Bruns 03/04/2019, 7:53 AM

## 2019-03-04 NOTE — Progress Notes (Signed)
Physical Therapy Treatment Patient Details Name: Drew Ochoa MRN: [id number] DOB: 1965-05-17 Today's Date: 03/04/2019    History of Present Illness Patient is 54 y.o. male s/p Rt TKA after prior UKA. Patient with PMH significant for HLD, OA.    PT Comments    POD # 1 General bed mobility comments: demonstarted and instructed how to use belt to self assist LE off bed.  General transfer comment: <25% VC's on proper hand placement and extension R LE prior to sit. General Gait Details: <25% VC's on proper walker to self distance and upright posture.  Tolerated an increased distance. Then returned to room to perform some TE's following HEP handout.  Instructed on proper tech, freq as well as use of ICE.   Addressed all mobility questions, discussed appropriate activity, educated on use of ICE.  Pt ready for D/C to home.today if MD agrees.    Follow Up Recommendations  Follow surgeon's recommendation for DC plan and follow-up therapies;Home health PT     Equipment Recommendations  Rolling walker with 5" wheels    Recommendations for Other Services       Precautions / Restrictions Precautions Precautions: Fall Precaution Comments: instructed no pillow under knee Restrictions Weight Bearing Restrictions: No Other Position/Activity Restrictions: WBAT    Mobility  Bed Mobility Overal bed mobility: Needs Assistance Bed Mobility: Supine to Sit     Supine to sit: Supervision;HOB elevated     General bed mobility comments: demonstarted and instructed how to use belt to self assist LE off bed  Transfers Overall transfer level: Needs assistance Equipment used: Rolling walker (2 wheeled) Transfers: Sit to/from [employer] Transfers Sit to Stand: From elevated surface;Supervision Stand pivot transfers: Supervision       General transfer comment: <25% VC's on proper hand placement and extension R LE prior to sit.  Ambulation/Gait Ambulation/Gait assistance:  Supervision;Min guard Gait Distance (Feet): 85 Feet Assistive device: Rolling walker (2 wheeled) Gait Pattern/deviations: Step-to pattern;Decreased weight shift to right;Decreased stance time - right;Decreased stride length;Decreased step length - left Gait velocity: decreased   General Gait Details: <25% VC's on proper walker to self distance and upright posture.  Tolerated an increased distance.   Stairs Stairs: Yes Stairs assistance: Supervision;Min guard Stair Management: One rail Right;Step to pattern;Forwards;Sideways Number of Stairs: 2 General stair comments: using both hands on one rail with 25% VC's on proper sequencing and safety.  Performed twice.  Did well.   Wheelchair Mobility    Modified Rankin (Stroke Patients Only)       Balance                                            Cognition Arousal/Alertness: Awake/alert Behavior During Therapy: WFL for tasks assessed/performed Overall Cognitive Status: Within Functional Limits for tasks assessed                                        Exercises   Total Knee Replacement TE's 10 reps B LE ankle pumps 10 reps towel squeezes 10 reps knee presses 10 reps heel slides  10 reps SAQ's 10 reps SLR's 10 reps ABD Followed by ICE     General Comments        Pertinent Vitals/Pain Pain Assessment: 0-10 Pain Score: 5  Pain Location: Rt  knee Pain Descriptors / Indicators: Aching;Tender;Tightness Pain Intervention(s): Monitored during session;Premedicated before session;Repositioned;Ice applied    Home Living                      Prior Function            PT Goals (current goals can now be found in the care plan section) Progress towards PT goals: Progressing toward goals    Frequency    7X/week      PT Plan Current plan remains appropriate    Co-evaluation              AM-PAC PT "6 Clicks" Mobility   Outcome Measure  Help needed turning from your  back to your side while in a flat bed without using bedrails?: None Help needed moving from lying on your back to sitting on the side of a flat bed without using bedrails?: None Help needed moving to and from a bed to a chair (including a wheelchair)?: None Help needed standing up from a chair using your arms (e.g., wheelchair or bedside chair)?: None Help needed to walk in hospital room?: None Help needed climbing 3-5 steps with a railing? : A Little 6 Click Score: 23    End of Session Equipment Utilized During Treatment: Gait belt Activity Tolerance: Patient tolerated treatment well Patient left: with call bell/phone within reach;with chair alarm set;in chair Nurse Communication: Mobility status(if MD agrees, pt ready for D/C to home today) PT Visit Diagnosis: Unsteadiness on feet (R26.81);Difficulty in walking, not elsewhere classified (R26.2)     Time: [id number] PT Time Calculation (min) (ACUTE ONLY): 28 min  Charges:  $Gait Training: 8-22 mins $Therapeutic Exercise: 8-22 mins                     Rica Koyanagi  PTA Acute  Rehabilitation Services Pager      [phone] Office      [phone]

## 2019-03-04 NOTE — TOC Initial Note (Addendum)
Transition of Care [facility]) - Initial/Assessment Note    Patient Details  Name: Drew Ochoa MRN: [id number] Date of Birth: 1965/01/25  Transition of Care [facility]) CM/SW Contact:    Lia Hopping, [place] Phone Number: 03/04/2019, 11:19 AM  Clinical Narrative:                 Patient reports he does not have a RW at home. RW ordered through [facility] and delivered to the patient room.   HHPT- CSW called the patient insurance for HHPT authorization, no authorization. CSW spoke with Rep. Dina. She reportd the patient has PT has benefits, approved for 7 days of therapy. She instructed CSW to go to the company web site for in network [facility]. agencies.   [facility] take [employer]/or products of [place]- out of service/no working contact number   [facility] contract [facility]- out of [place] out to the Care Coordinator- ([employer]) [phone]. The only in network agency in patient [place]/zipcode area is [facility]. [facility] cannot provide date when PT staff will be available to see the patient.   CSW reached out to PA-Blaine and notified her of the [place]. PA to arrange OPPT.      Barriers to Discharge: Continued Medical Work up   Patient Goals and CMS Choice   CMS Medicare.gov Compare Post Acute Care list provided to:: Patient Choice offered to / list presented to : Patient  Expected Discharge Plan and Services                           DME Arranged: Walker rolling DME Agency: [employer] Date DME Agency Contacted: 03/04/19 Time DME Agency Contacted: [id number] Representative spoke with at DME Agency: Elna Breslow Arranged: PT          Prior Living Arrangements/Services                       Activities of Daily Living Home Assistive Devices/Equipment: Eyeglasses, CPAP ADL Screening (condition at time of  admission) Patient's cognitive ability adequate to safely complete daily activities?: Yes Is the patient deaf or have difficulty hearing?: No Does the patient have difficulty seeing, even when wearing glasses/contacts?: No Does the patient have difficulty concentrating, remembering, or making decisions?: No Patient able to express need for assistance with ADLs?: Yes Does the patient have difficulty dressing or bathing?: No Independently performs ADLs?: Yes (appropriate for developmental age) Does the patient have difficulty walking or climbing stairs?: Yes Weakness of Legs: Right Weakness of Arms/Hands: None  Permission Sought/Granted                  Emotional Assessment              Admission diagnosis:  S/P TKR (total knee replacement) using cement, right [Z96.651] S/P revision of total knee [Z96.659] Patient Active Problem List   Diagnosis Date Noted  . Failed partial right knee replacement ([place]) 03/03/2019  . S/P TKR (total knee replacement) using cement, right 03/03/2019  . S/P revision of total knee 03/03/2019  . OSA (obstructive sleep apnea) 10/07/2014  . History of anemia 09/22/2012  . Knee pain, bilateral 08/26/2011  . Hyperlipidemia 09/05/2010   PCP:  Elby Showers, MD Pharmacy:   [facility] [address] [place], [place] [phone]  [place] [address] Phone: [phone] Fax: [phone]  [id number] - [place], [place] [facility] [address] [place] [address] Phone: [phone] Fax: [phone]     Social Determinants of Health ([place]) Interventions    Readmission Risk Interventions No flowsheet data found.

## 2019-03-04 NOTE — Progress Notes (Signed)
D/C instructions given to patient. Patient had no questions. NT or writer can wheel patient out once family comes in

## 2019-03-04 NOTE — Progress Notes (Signed)
Reexamined Drew Ochoa, he is feeling significantly better and is ready to go home today. Will coordinate outpatient PT with our office at [facility].

## 2019-03-04 NOTE — Discharge Summary (Signed)
Discharge Summary  Patient ID: Drew Ochoa MRN: [id number] DOB/AGE: 03-15-1965 54 y.o.  Admit date: 03/03/2019 Discharge date: 03/04/2019  Admission Diagnoses:  Failed total right knee replacement [facility])  Discharge Diagnoses:  Principal Problem:   Failed partial right knee replacement (HCC) Active Problems:   S/P TKR (total knee replacement) using cement, right   S/P revision of total knee   Past Medical History:  Diagnosis Date  . Arthritis   . Blood in stool   . Bronchitis    hx of   . Colon polyps   . Complication of anesthesia    hard to wake up, "slept too long"  . Complication of anesthesia    problems urinating  . Hemorrhoids   . History of kidney stones   . Hyperlipidemia   . Iron deficiency anemia    history of  . OSA on CPAP   . Skin cancer    left shoulder  . Weakness     Surgeries: Procedure(s): CONVERSION OF PARTIAL KNEE TO TOTAL KNEE ARTHROPLASTY on 03/03/2019   Consultants (if any):   Discharged Condition: Improved  Hospital Course: Drew Ochoa is an 54 y.o. male who was admitted 03/03/2019 with a diagnosis of Failed total right knee replacement ([place]) and went to the operating room on 03/03/2019 and underwent the above named procedures.    He was given perioperative antibiotics:  Anti-infectives (From admission, onward)   Start     Dose/Rate Route Frequency Ordered Stop   03/03/19 1400  ceFAZolin (ANCEF) IVPB 2g/100 mL premix     2 g 200 mL/hr over 30 Minutes Intravenous Every 6 hours 03/03/19 1156 03/03/19 2039   03/03/19 0600  ceFAZolin (ANCEF) IVPB 2g/100 mL premix     2 g 200 mL/hr over 30 Minutes Intravenous On call to O.R. 03/03/19 [id number] 03/03/19 0731    .  He was given sequential compression devices, early ambulation, and aspirin for DVT prophylaxis.  He benefited maximally from the hospital stay and there were no complications.    Recent vital signs:  Vitals:   03/04/19 0536 03/04/19 1000  BP: 116/71 120/85  Pulse: 62 76   Resp: 15 18  Temp: (!) 97.4 F (36.3 C) 98.4 F (36.9 C)  SpO2: 100% 100%    Recent laboratory studies:  Lab Results  Component Value Date   HGB 10.6 (L) 03/04/2019   HGB 12.4 (L) 02/23/2019   HGB 13.8 11/10/2018   Lab Results  Component Value Date   WBC 14.0 (H) 03/04/2019   PLT 199 03/04/2019   No results found for: INR Lab Results  Component Value Date   NA 139 03/04/2019   K 3.9 03/04/2019   CL 108 03/04/2019   CO2 22 03/04/2019   BUN 18 03/04/2019   CREATININE 1.02 03/04/2019   GLUCOSE 131 (H) 03/04/2019    Discharge Medications:   Allergies as of 03/04/2019   No Known Allergies     Medication List    STOP taking these medications   aspirin 325 MG tablet Replaced by: aspirin EC 325 MG tablet     TAKE these medications   aspirin EC 325 MG tablet Take 1 tablet (325 mg total) by mouth 2 (two) times daily. Replaces: aspirin 325 MG tablet   baclofen 10 MG tablet Commonly known as: LIORESAL Take 1 tablet (10 mg total) by mouth 3 (three) times daily. As needed for muscle spasm   ondansetron 4 MG tablet Commonly known as: Zofran Take 1 tablet (4 mg total)  by mouth every 8 (eight) hours as needed for nausea or vomiting.   oxyCODONE 5 MG immediate release tablet Commonly known as: Roxicodone Take 1 tablet (5 mg total) by mouth every 4 (four) hours as needed for severe pain.   rosuvastatin 5 MG tablet Commonly known as: Crestor One po 3 times a week with supper What changed:   how much to take  how to take this  when to take this  additional instructions   sennosides-docusate sodium 8.6-50 MG tablet Commonly known as: SENOKOT-S Take 2 tablets by mouth daily.       Diagnostic Studies: DG Knee Right Port  Result Date: 03/03/2019 CLINICAL DATA:  Post RIGHT total knee replacement EXAM: PORTABLE RIGHT KNEE - 1-2 VIEW COMPARISON:  01/16/2019 FINDINGS: Components of a RIGHT total knee arthroplasty are identified in expected position. No fracture,  dislocation, or bone destruction. Small amount of fluid and gas within the knee joint. IMPRESSION: Post RIGHT total knee arthroplasty. No acute abnormalities. Electronically Signed   By: Lavonia Dana M.D.   On: 03/03/2019 13:46    Disposition: Discharge disposition: 01-Home or Self Care       Discharge Instructions    Discharge patient   Complete by: As directed    Discharge disposition: 01-Home or Self Care   Discharge patient date: 03/04/2019      Follow-up Information    Marchia Bond, MD. Schedule an appointment as soon as possible for a visit in 2 weeks.   Specialty: Orthopedic Surgery Contact information: [place] [address] [phone]            Signed: Jola Baptist 03/04/2019, 1:40 PM

## 2019-03-06 NOTE — Anesthesia Postprocedure Evaluation (Signed)
Anesthesia Post Note  Patient: Drew Ochoa  Procedure(s) Performed: CONVERSION OF PARTIAL KNEE TO TOTAL KNEE ARTHROPLASTY (Right Knee)     Patient location during evaluation: PACU Anesthesia Type: Spinal and Regional Level of consciousness: oriented and awake and alert Pain management: pain level controlled Vital Signs Assessment: post-procedure vital signs reviewed and stable Respiratory status: spontaneous breathing, respiratory function stable and patient connected to nasal cannula oxygen Cardiovascular status: blood pressure returned to baseline and stable Postop Assessment: no headache, no backache and no apparent nausea or vomiting Anesthetic complications: no    Last Vitals:  Vitals:   03/04/19 0536 03/04/19 1000  BP: 116/71 120/85  Pulse: 62 76  Resp: 15 18  Temp: (!) 36.3 C 36.9 C  SpO2: 100% 100%    Last Pain:  Vitals:   03/04/19 1000  TempSrc: Oral  PainSc:    Pain Goal: Patients Stated Pain Goal: 2 (03/04/19 [id number])                 Drew Ochoa S

## 2019-03-08 LAB — AEROBIC/ANAEROBIC CULTURE W GRAM STAIN (SURGICAL/DEEP WOUND): Culture: NO GROWTH

## 2019-11-10 ENCOUNTER — Other Ambulatory Visit: Payer: Self-pay

## 2019-11-10 ENCOUNTER — Other Ambulatory Visit: Payer: 59 | Admitting: Internal Medicine

## 2019-11-10 DIAGNOSIS — Z125 Encounter for screening for malignant neoplasm of prostate: Secondary | ICD-10-CM

## 2019-11-10 DIAGNOSIS — M25561 Pain in right knee: Secondary | ICD-10-CM

## 2019-11-10 DIAGNOSIS — G4730 Sleep apnea, unspecified: Secondary | ICD-10-CM

## 2019-11-10 DIAGNOSIS — M25562 Pain in left knee: Secondary | ICD-10-CM

## 2019-11-10 DIAGNOSIS — Z0000 Encounter for general adult medical examination without abnormal findings: Secondary | ICD-10-CM

## 2019-11-10 DIAGNOSIS — G8929 Other chronic pain: Secondary | ICD-10-CM

## 2019-11-10 DIAGNOSIS — E7800 Pure hypercholesterolemia, unspecified: Secondary | ICD-10-CM

## 2019-11-11 LAB — CBC WITH DIFFERENTIAL/PLATELET
Absolute Monocytes: 509 cells/uL (ref 200–950)
Basophils Absolute: 42 cells/uL (ref 0–200)
Basophils Relative: 0.8 %
Eosinophils Absolute: 90 cells/uL (ref 15–500)
Eosinophils Relative: 1.7 %
HCT: 39.2 % (ref 38.5–50.0)
Hemoglobin: 12.8 g/dL — ABNORMAL LOW (ref 13.2–17.1)
Lymphs Abs: 1558 cells/uL (ref 850–3900)
MCH: 25.8 pg — ABNORMAL LOW (ref 27.0–33.0)
MCHC: 32.7 g/dL (ref 32.0–36.0)
MCV: 78.9 fL — ABNORMAL LOW (ref 80.0–100.0)
MPV: 9.5 fL (ref 7.5–12.5)
Monocytes Relative: 9.6 %
Neutro Abs: 3101 cells/uL (ref 1500–7800)
Neutrophils Relative %: 58.5 %
Platelets: 296 10*3/uL (ref 140–400)
RBC: 4.97 10*6/uL (ref 4.20–5.80)
RDW: 14.9 % (ref 11.0–15.0)
Total Lymphocyte: 29.4 %
WBC: 5.3 10*3/uL (ref 3.8–10.8)

## 2019-11-11 LAB — COMPLETE METABOLIC PANEL WITH GFR
AG Ratio: 1.8 (calc) (ref 1.0–2.5)
ALT: 16 U/L (ref 9–46)
AST: 23 U/L (ref 10–35)
Albumin: 4.2 g/dL (ref 3.6–5.1)
Alkaline phosphatase (APISO): 43 U/L (ref 35–144)
BUN: 16 mg/dL (ref 7–25)
CO2: 26 mmol/L (ref 20–32)
Calcium: 9.2 mg/dL (ref 8.6–10.3)
Chloride: 103 mmol/L (ref 98–110)
Creat: 1.14 mg/dL (ref 0.70–1.33)
GFR, Est African American: 85 mL/min/{1.73_m2} (ref >=60)
GFR, Est Non African American: 74 mL/min/{1.73_m2} (ref >=60)
Globulin: 2.4 g/dL (calc) (ref 1.9–3.7)
Glucose, Bld: 85 mg/dL (ref 65–99)
Potassium: 4.4 mmol/L (ref 3.5–5.3)
Sodium: 138 mmol/L (ref 135–146)
Total Bilirubin: 0.9 mg/dL (ref 0.2–1.2)
Total Protein: 6.6 g/dL (ref 6.1–8.1)

## 2019-11-11 LAB — LIPID PANEL
Cholesterol: 220 mg/dL — ABNORMAL HIGH (ref <200)
HDL: 82 mg/dL (ref >=40)
LDL Cholesterol (Calc): 121 mg/dL (calc) — ABNORMAL HIGH
Non-HDL Cholesterol (Calc): 138 mg/dL (calc) — ABNORMAL HIGH (ref <130)
Total CHOL/HDL Ratio: 2.7 (calc) (ref <5.0)
Triglycerides: 77 mg/dL (ref <150)

## 2019-11-11 LAB — PSA: PSA: 1.02 ng/mL (ref <=4.0)

## 2019-11-12 ENCOUNTER — Other Ambulatory Visit: Payer: Self-pay

## 2019-11-12 ENCOUNTER — Encounter: Payer: Self-pay | Admitting: Internal Medicine

## 2019-11-12 ENCOUNTER — Ambulatory Visit (INDEPENDENT_AMBULATORY_CARE_PROVIDER_SITE_OTHER): Payer: 59 | Admitting: Internal Medicine

## 2019-11-12 VITALS — BP 100/70 | HR 90 | Ht 68.0 in | Wt 196.0 lb

## 2019-11-12 DIAGNOSIS — Z0000 Encounter for general adult medical examination without abnormal findings: Secondary | ICD-10-CM

## 2019-11-12 DIAGNOSIS — D649 Anemia, unspecified: Secondary | ICD-10-CM | POA: Diagnosis not present

## 2019-11-12 DIAGNOSIS — G8929 Other chronic pain: Secondary | ICD-10-CM

## 2019-11-12 DIAGNOSIS — Z8719 Personal history of other diseases of the digestive system: Secondary | ICD-10-CM

## 2019-11-12 DIAGNOSIS — R718 Other abnormality of red blood cells: Secondary | ICD-10-CM | POA: Diagnosis not present

## 2019-11-12 DIAGNOSIS — E7800 Pure hypercholesterolemia, unspecified: Secondary | ICD-10-CM

## 2019-11-12 DIAGNOSIS — G4730 Sleep apnea, unspecified: Secondary | ICD-10-CM

## 2019-11-12 DIAGNOSIS — Z86010 Personal history of colonic polyps: Secondary | ICD-10-CM

## 2019-11-12 DIAGNOSIS — M25562 Pain in left knee: Secondary | ICD-10-CM

## 2019-11-12 DIAGNOSIS — Z96651 Presence of right artificial knee joint: Secondary | ICD-10-CM

## 2019-11-12 LAB — POCT URINALYSIS DIPSTICK
Appearance: NEGATIVE
Bilirubin, UA: NEGATIVE
Blood, UA: NEGATIVE
Glucose, UA: NEGATIVE
Ketones, UA: NEGATIVE
Leukocytes, UA: NEGATIVE
Nitrite, UA: NEGATIVE
Odor: NEGATIVE
Protein, UA: NEGATIVE
Spec Grav, UA: 1.01 (ref 1.010–1.025)
Urobilinogen, UA: 0.2 E.U./dL
pH, UA: 6.5 (ref 5.0–8.0)

## 2019-11-12 NOTE — Progress Notes (Signed)
Subjective:    Patient ID: Drew Ochoa, male    DOB: October 11, 1965, 54 y.o.   MRN: [id number]  HPI Pleasant 54 year old Male seen today for annual health maintenance exam. His general health is excellent. He is physically active and stays in shape.  However, recently diagnosed with tricompartmental arthritis left knee by Dr. Mardelle Matte and is to have left knee arthroplasty at [place] later this month.  Patient had a history of partial right knee replacement by Dr. Mardelle Matte in 2016.  In  February 2021, he had conversion of right partial knee to right total knee arthroplasty because of persistent pain.  He has a history of pure hypercholesterolemia and is maintained on Crestor 5 mg 3 times a week with supper.  Total cholesterol is 220, HDL is excellent at 82, triglycerides are 77 and normal.  LDL slightly elevated at 121.  9 months ago his lipid panel was completely normal on low-dose Crestor 3 times a week.  I suspect the elevation is due to lack of exercise due to knee pain.  He will continue with this course of treatment.  He has a remote history of iron deficiency anemia thought to be due to significant rectal bleeding from hemorrhoids.  He had a thorough GI work-up in 2012 and took iron supplements.  Right rotator cuff repair by Dr. Suszanne Conners in 2014.  2 small benign hyperplastic polyps in rectum on colonoscopy 2012 with recall due 2019.  Has not done this due to the pandemic.  In October 2012 he had upper endoscopy showing antral gastritis.  In 2014 he had transanal hemorrhoidal dearterialization by Dr. Leighton Ruff.  Social history: He is married.  No children.  Social alcohol consumption.  Occasionally smokes cigars.  He works for [employer].  Family history: Grandmother with history of massive stroke at age 28 and died at age 67.  Maternal grandfather had CABG at age 6 and died at age 77.  Paternal grandfather with history of alcoholism recovered at age 51.  He subsequently had  CABG at age 94 and died at age 45.  Paternal grandmother in good health.  Mother with history of depression and skin cancer.  Father with history of rheumatoid arthritis diagnosed at age 23 and also with history of colon polyps.   Recently hasn't been able to exercise as  much due to knee pain.  Have given him a small quantity of Norco 10/325 to take sparingly for pain until the surgery.  He has had 2 COVID-19 immunizations in March and April of this year.  Tetanus immunization is up-to-date.    Review of Systems reports no rectal bleeding. Not taking NSAIDs. His Hgb is slightly low at 12.8 grams but never has higher Hgb than 14.5 grams. MCV is 78.9. may have some mild iron deficiency and is to have repeat Hgb and Fe/TIBC. Is due for repeat colonoscopy.     Objective:   Physical Exam Blood pressure 100/70 pulse 90 pulse oximetry 97% weight 196 pounds height 5 feet 8 inches BMI 29.80  Skin warm and dry.  No cervical adenopathy.  TMs are clear.  Neck is supple without JVD thyromegaly or carotid bruits.  Chest is clear to auscultation.  Cardiac exam regular rate and rhythm normal S1 and S2 without murmurs or gallops.  Abdomen soft nondistended without hepatosplenomegaly masses or tenderness.  Prostate is normal.  No stool to guaiac.  No lower extremity pitting edema.  Neuro intact without focal deficits.  Affect thought and  judgment are normal.       Assessment & Plan:   Hyperlipidemia-has not been consistent with Crestor.Will start taking regularly and follow up in 6 months.  History of pure hypercholesterolemia.  Mild anemia (hemoglobin 12.8 g) with MCV 78.9. Will have repeat CBC, Fe TIBC.He is past due for repeat colonoscopy and needs GI follow up after left TKA. Not taking NSAIDS.  Suspect he has iron deficiency.  If he is iron deficient, I doubt this will delay his surgery.  He is in a considerable amount of pain from his left knee.  He can be started on iron supplement if necessary with  follow-up here post surgery.  Left knee osteoarthritis and scheduled for left TKA on November 18.  Given small quantity of hydrocodone APAP 10/325-he can break tablet in half and take sparingly for pain.  Is in considerable amount of pain at this time.  Status post right TKA February 2021.  Partial right knee replacement converted to total right knee replacement at that time.  History of partial right knee replacement 2016  Remote history of rectal bleeding but not recently.  Not taking NSAIDs at the present time.  Remote history of antral gastritis October 2012 on upper endoscopy.  History of hyperplastic colon polyps.  History of sleep apnea and has CPAP device.

## 2019-11-20 ENCOUNTER — Telehealth: Payer: Self-pay | Admitting: Internal Medicine

## 2019-11-20 NOTE — Telephone Encounter (Signed)
Someone from the [facility] called to see if they could possible get notes from Drew Ochoa's most recent office visit 11/12/2019, the last notes they have are from February. He is scheduled to have his other knee surgery sometime next week.

## 2019-11-21 ENCOUNTER — Encounter: Payer: Self-pay | Admitting: Internal Medicine

## 2019-11-21 MED ORDER — HYDROCODONE-ACETAMINOPHEN 10-325 MG PO TABS: 1.0000 | ORAL_TABLET | Freq: Three times a day (TID) | ORAL | 0 refills | Status: AC | PRN

## 2019-11-21 NOTE — Patient Instructions (Signed)
Patient needs to have repeat CBC, iron and iron binding capacity before surgery.  He may need iron supplementation.  He will need GI follow-up after he recovers from left knee total arthroplasty which is scheduled for November 18.  He is past due for GI follow-up last done 2019.  He is not taking NSAIDs.  He has not been taking Crestor regularly and will start to do so.

## 2019-11-21 NOTE — Telephone Encounter (Signed)
He is coming in at [facility] Monday to have repeat CBC and iron level drawn

## 2019-11-23 ENCOUNTER — Telehealth: Payer: Self-pay | Admitting: Internal Medicine

## 2019-11-23 ENCOUNTER — Other Ambulatory Visit: Payer: Self-pay

## 2019-11-23 ENCOUNTER — Other Ambulatory Visit: Payer: 59 | Admitting: Internal Medicine

## 2019-11-23 DIAGNOSIS — Z9889 Other specified postprocedural states: Secondary | ICD-10-CM

## 2019-11-23 LAB — CBC WITH DIFFERENTIAL/PLATELET
Absolute Monocytes: 534 cells/uL (ref 200–950)
Basophils Absolute: 61 cells/uL (ref 0–200)
Basophils Relative: 1.1 %
Eosinophils Absolute: 121 cells/uL (ref 15–500)
Eosinophils Relative: 2.2 %
HCT: 38.3 % — ABNORMAL LOW (ref 38.5–50.0)
Hemoglobin: 12.6 g/dL — ABNORMAL LOW (ref 13.2–17.1)
Lymphs Abs: 1634 cells/uL (ref 850–3900)
MCH: 26.1 pg — ABNORMAL LOW (ref 27.0–33.0)
MCHC: 32.9 g/dL (ref 32.0–36.0)
MCV: 79.3 fL — ABNORMAL LOW (ref 80.0–100.0)
MPV: 9.7 fL (ref 7.5–12.5)
Monocytes Relative: 9.7 %
Neutro Abs: 3152 cells/uL (ref 1500–7800)
Neutrophils Relative %: 57.3 %
Platelets: 303 10*3/uL (ref 140–400)
RBC: 4.83 10*6/uL (ref 4.20–5.80)
RDW: 14.2 % (ref 11.0–15.0)
Total Lymphocyte: 29.7 %
WBC: 5.5 10*3/uL (ref 3.8–10.8)

## 2019-11-23 LAB — IRON,TIBC AND FERRITIN PANEL
%SAT: 16 % (calc) — ABNORMAL LOW (ref 20–48)
Ferritin: 10 ng/mL — ABNORMAL LOW (ref 38–380)
Iron: 74 ug/dL (ref 50–180)
TIBC: 456 mcg/dL (calc) — ABNORMAL HIGH (ref 250–425)

## 2019-11-23 NOTE — Telephone Encounter (Signed)
Spoke with him about iron studies and recommended iron supplement twice a day with follow up here in 6 weeks.

## 2019-11-23 NOTE — Telephone Encounter (Signed)
Scheduled

## 2019-11-28 ENCOUNTER — Other Ambulatory Visit: Payer: Self-pay | Admitting: Internal Medicine

## 2019-11-30 ENCOUNTER — Other Ambulatory Visit: Payer: 59 | Admitting: Internal Medicine

## 2019-12-07 ENCOUNTER — Encounter: Payer: 59 | Admitting: Internal Medicine

## 2019-12-22 ENCOUNTER — Telehealth: Payer: Self-pay | Admitting: Internal Medicine

## 2019-12-22 MED ORDER — ROSUVASTATIN CALCIUM 5 MG PO TABS: ORAL_TABLET | ORAL | 3 refills | Status: DC

## 2019-12-22 NOTE — Telephone Encounter (Signed)
Please refill x 6 months 

## 2019-12-22 NOTE — Telephone Encounter (Signed)
Received Fax RX request from  [place]  Medication - rosuvastatin (CRESTOR) 5 MG tablet -- 90 Day supply   Last Refill -  Last OV - 11/12/2019  Last CPE - 11/12/2019  Next Appointment - 05/12/2020

## 2020-05-09 ENCOUNTER — Other Ambulatory Visit: Payer: 59 | Admitting: Internal Medicine

## 2020-05-09 DIAGNOSIS — E7800 Pure hypercholesterolemia, unspecified: Secondary | ICD-10-CM

## 2020-05-12 ENCOUNTER — Ambulatory Visit: Payer: 59 | Admitting: Internal Medicine

## 2020-08-09 ENCOUNTER — Encounter: Payer: Self-pay | Admitting: Internal Medicine

## 2020-08-18 ENCOUNTER — Telehealth: Payer: Self-pay | Admitting: Internal Medicine

## 2020-08-18 ENCOUNTER — Other Ambulatory Visit: Payer: Self-pay

## 2020-08-18 ENCOUNTER — Telehealth (INDEPENDENT_AMBULATORY_CARE_PROVIDER_SITE_OTHER): Payer: BC Managed Care – PPO | Admitting: Internal Medicine

## 2020-08-18 ENCOUNTER — Encounter: Payer: Self-pay | Admitting: Internal Medicine

## 2020-08-18 DIAGNOSIS — U071 COVID-19: Secondary | ICD-10-CM

## 2020-08-18 MED ORDER — BENZONATATE 100 MG PO CAPS: 100.0000 mg | ORAL_CAPSULE | Freq: Two times a day (BID) | ORAL | 0 refills | Status: DC | PRN

## 2020-08-18 MED ORDER — AZITHROMYCIN 250 MG PO TABS: ORAL_TABLET | ORAL | 0 refills | Status: AC

## 2020-08-18 NOTE — Telephone Encounter (Signed)
Drew Ochoa [phone]  Drew Ochoa has tested positive for COVID, he has Sore throat , stuffy nose , just feels like bad cold. He is in [place] for grandmothers birthday party. Has had 2 vaccines and 1 booster

## 2020-08-18 NOTE — Patient Instructions (Addendum)
Rest and drink plenty of fluids.  Stay well-hydrated.  Take Zithromax Z-PAK 2 tabs day 1 followed by 1 tab days 2 through 5.  Take Tessalon Perles up to 3 times daily as needed for cough

## 2020-08-18 NOTE — Progress Notes (Signed)
   Subjective:    Patient ID: Drew Ochoa, male    DOB: 1965/06/25, 55 y.o.   MRN: [id number]  HPI:  On August 18, 2020, I connected with Drew Ochoa, a patient in this practice, DOB 08-Mar-1965   by a video enabled telemedicine application and verified that I am speaking with the correct person using two identifiers.    Patient is visiting family in [place], [place] and is seen outside his apartment where he is studying.  I am in my office in [place] ,[place].  He is agreeable to visit in this format today.  Patient traveled to [place], [place] for a class reunion which he attended on Friday August 5th.  Yesterday, he tested positive for COVID-19 and has found out that other individuals that attended the class reunion have also tested positive for COVID-19.  On Saturday, August 6, he developed a sore throat.  Now feels that he has cold symptoms mainly with nasal congestion.  He denies nausea.  No vomiting or diarrhea.  Does not have much cough.  Does have a slight headache.  He has no shortness of breath.  His wife is with him and has tested negative for COVID-19 thus far.  He says he has no fever. Says he feels like he  He is in excellent physical condition.  He has pure hypercholesterolemia treated with Crestor.  He had left knee arthroplasty by Dr. Mardelle Matte in November 2021, right rotator cuff repair in 2014, right partial knee replacement by Dr. Mardelle Matte in 2016.  In February 2021, he had conversion of right partial knee to right total knee arthroplasty because of persistent pain.  Remote history of iron deficiency due to bleeding hemorrhoids and had a transanal hemorrhoidal dearterialization by Dr. Leighton Ruff in [id number].  He was vaccinated twice for COVID-19 in March and April 2021 and says he has had 1 additional booster but not recently.   Review of Systems see above regarding symptoms     Objective:   Physical Exam He is seen virtually in no acute  distress.  He is not pale.  He is not tachypneic.  Not heard to be coughing at the present time.     Assessment & Plan:  Acute COVID-19 virus infection  Plan: I do not think given his medical history that he would qualify for Paxlovid.  He will be prescribed Zithromax Z-PAK 2 tabs day 1 followed by 1 tab days 2 through 5.  Also prescribed Tessalon Perles 100 mg  up to 3 times daily as needed for cough.  Rest and drink plenty of fluids.  Advised to stay well-hydrated.  He plans on driving back to [place] this coming weekend.  He will need to quarantine for a minimum of 5 days.

## 2020-08-18 NOTE — Telephone Encounter (Signed)
scheduled

## 2020-08-19 ENCOUNTER — Telehealth: Payer: Self-pay | Admitting: Internal Medicine

## 2020-08-19 NOTE — Telephone Encounter (Signed)
[place] results to [facility] [phone], phone [phone]   Positive 08/17/2020

## 2020-08-29 ENCOUNTER — Telehealth: Payer: Self-pay | Admitting: *Deleted

## 2020-08-29 ENCOUNTER — Ambulatory Visit (AMBULATORY_SURGERY_CENTER): Payer: 59 | Admitting: *Deleted

## 2020-08-29 ENCOUNTER — Other Ambulatory Visit: Payer: Self-pay

## 2020-08-29 VITALS — Ht 68.0 in | Wt 185.0 lb

## 2020-08-29 DIAGNOSIS — Z1211 Encounter for screening for malignant neoplasm of colon: Secondary | ICD-10-CM

## 2020-08-29 MED ORDER — SUTAB 1479-225-188 MG PO TABS: 1.0000 | ORAL_TABLET | Freq: Once | ORAL | 0 refills | Status: AC

## 2020-08-29 NOTE — Progress Notes (Signed)
Virtual pre visit completed over telephone. Instructions mailed with Sutab coupon and e-mailed to [email or web address]   No egg or soy allergy known to patient  No issues with past sedation with any surgeries or procedures Patient denies ever being told they had issues or difficulty with intubation  No FH of Malignant Hyperthermia No diet pills per patient No home 02 use per patient  No blood thinners per patient \  Pt denies issues with constipation   No A fib or A flutter  EMMI video to pt or via [place] 19 guidelines implemented in [place] today with Pt and RN   Coupon mailed to patient and Code to Pharmacy and  NO PA's for preps discussed with pt In PV today  Discussed with pt there will be an out-of-pocket cost for prep and that varies from $0 to 70 +  dollars   Due to the COVID-19 pandemic we are asking patients to follow certain guidelines.  Pt aware of COVID protocols and [facility] guidelines

## 2020-08-30 NOTE — Telephone Encounter (Signed)
Patient returned call, pv completed

## 2020-09-19 ENCOUNTER — Other Ambulatory Visit: Payer: 59 | Admitting: Internal Medicine

## 2020-09-19 DIAGNOSIS — E7800 Pure hypercholesterolemia, unspecified: Secondary | ICD-10-CM

## 2020-09-19 DIAGNOSIS — E785 Hyperlipidemia, unspecified: Secondary | ICD-10-CM

## 2020-09-19 DIAGNOSIS — Z0000 Encounter for general adult medical examination without abnormal findings: Secondary | ICD-10-CM

## 2020-09-19 DIAGNOSIS — D649 Anemia, unspecified: Secondary | ICD-10-CM

## 2020-09-20 ENCOUNTER — Encounter: Payer: 59 | Admitting: Internal Medicine

## 2020-09-26 ENCOUNTER — Encounter: Payer: 59 | Admitting: Internal Medicine

## 2020-09-28 ENCOUNTER — Encounter: Payer: Self-pay | Admitting: Internal Medicine

## 2020-10-12 ENCOUNTER — Encounter: Payer: 59 | Admitting: Internal Medicine

## 2020-10-28 DIAGNOSIS — G4733 Obstructive sleep apnea (adult) (pediatric): Secondary | ICD-10-CM | POA: Diagnosis not present

## 2021-03-01 ENCOUNTER — Encounter: Payer: Self-pay | Admitting: Internal Medicine

## 2021-03-02 IMAGING — DX DG KNEE 1-2V PORT*R*
2 series · 2 of 2 positions shown · non-contrast
Comparison: 01/16/2019

CLINICAL DATA: Post RIGHT total knee replacement

EXAM:
PORTABLE RIGHT KNEE - 1-2 VIEW

[knee ap]
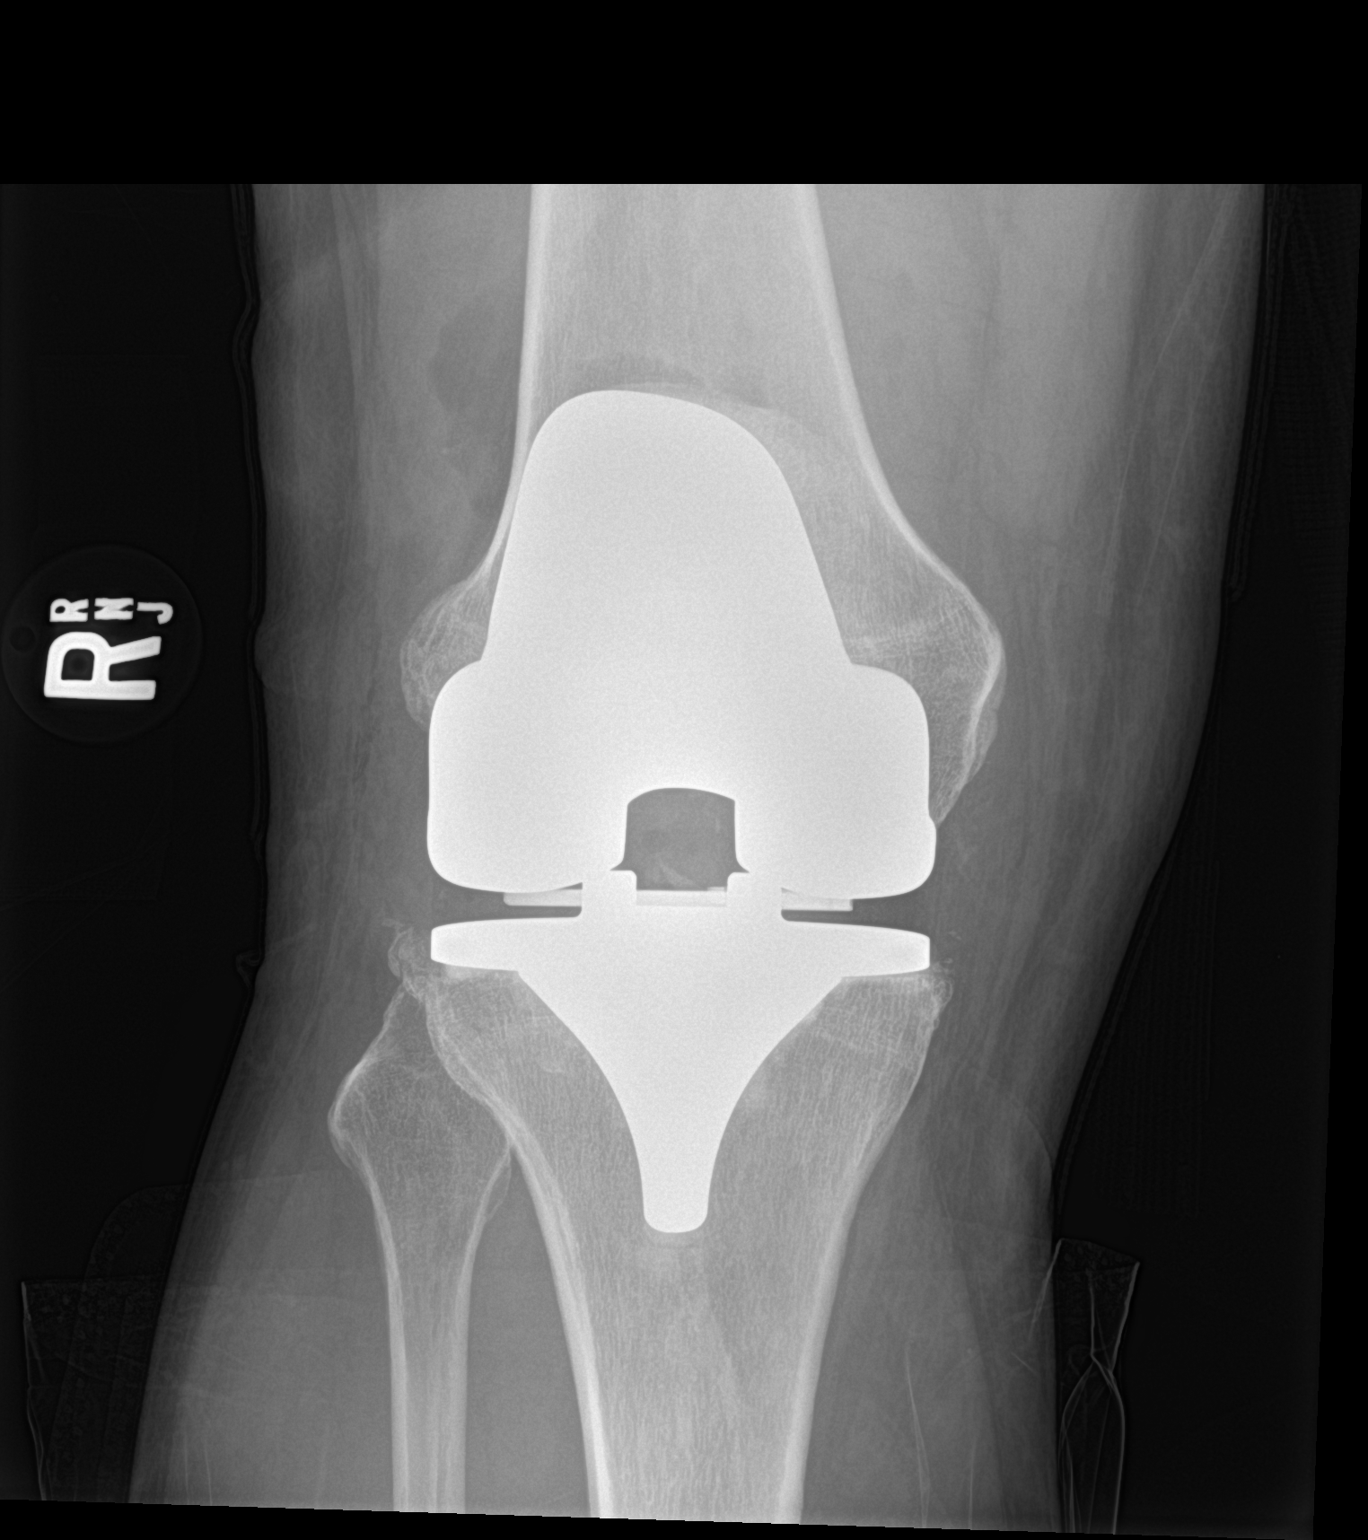

[knee lat]
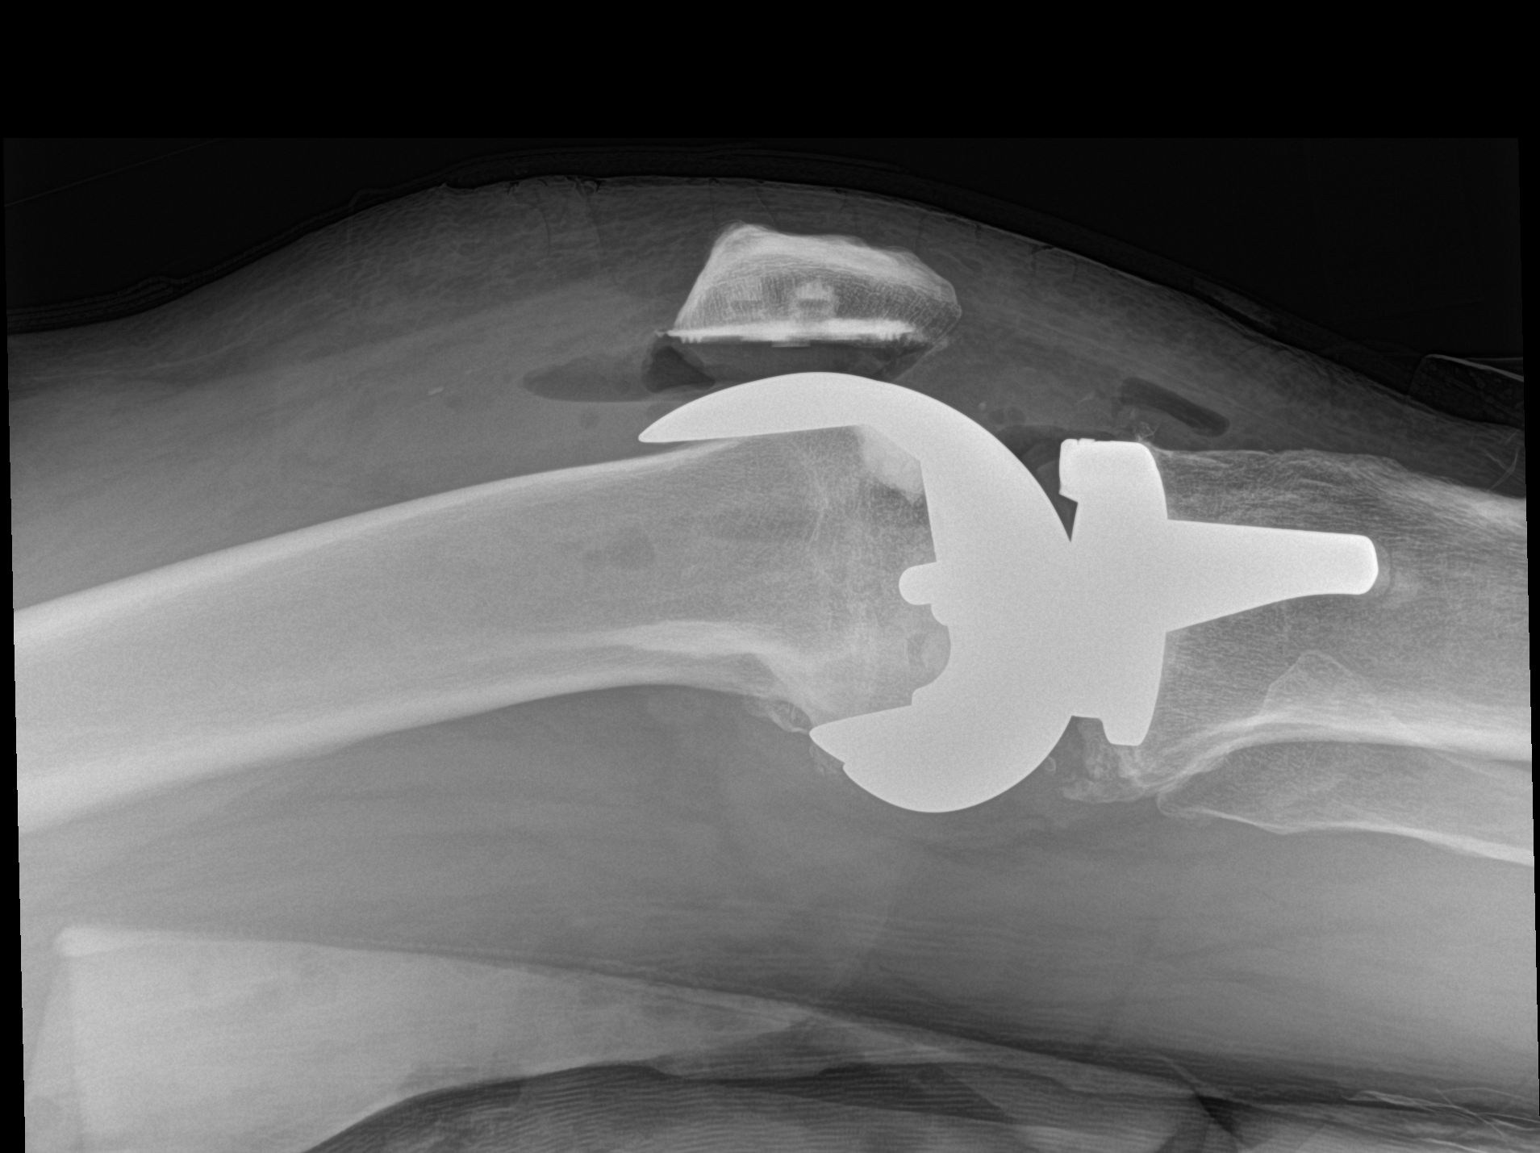

[2 of 2 positions shown; findings below may reference images not displayed]

FINDINGS: Components of a RIGHT total knee arthroplasty are identified in
expected position.

No fracture, dislocation, or bone destruction.

Small amount of fluid and gas within the knee joint.
IMPRESSION: Post RIGHT total knee arthroplasty.

No acute abnormalities.

## 2021-04-06 ENCOUNTER — Ambulatory Visit (AMBULATORY_SURGERY_CENTER): Payer: 59 | Admitting: *Deleted

## 2021-04-06 VITALS — Ht 68.0 in | Wt 185.0 lb

## 2021-04-06 DIAGNOSIS — Z1211 Encounter for screening for malignant neoplasm of colon: Secondary | ICD-10-CM

## 2021-04-06 NOTE — Progress Notes (Signed)
Patient's pre-visit was done today over the phone with the patient. Name,DOB and address verified. Patient denies any allergies to Eggs and Soy. Patient denies any problems with anesthesia/sedation. Patient is not taking any diet pills or blood thinners. No home Oxygen. Insurance confirmed with patient. Pt will email copy of new insurance card. ? ?Prep instructions sent to pt's mailed to pt-pt is aware. Patient understands to call us back with any questions or concerns. Patient is aware of our care-partner policy and [id number] safety protocol. Patient has sutab at home. ? ?The patient is COVID-19 vaccinated.   ?

## 2021-04-20 ENCOUNTER — Encounter: Payer: Self-pay | Admitting: Internal Medicine

## 2021-04-27 ENCOUNTER — Ambulatory Visit (AMBULATORY_SURGERY_CENTER): Payer: 59 | Admitting: Internal Medicine

## 2021-04-27 ENCOUNTER — Encounter: Payer: Self-pay | Admitting: Internal Medicine

## 2021-04-27 VITALS — BP 101/65 | HR 55 | Temp 96.6°F | Resp 14 | Ht 68.0 in | Wt 185.0 lb

## 2021-04-27 DIAGNOSIS — D125 Benign neoplasm of sigmoid colon: Secondary | ICD-10-CM | POA: Diagnosis not present

## 2021-04-27 DIAGNOSIS — D123 Benign neoplasm of transverse colon: Secondary | ICD-10-CM

## 2021-04-27 DIAGNOSIS — Z86010 Personal history of colonic polyps: Secondary | ICD-10-CM

## 2021-04-27 DIAGNOSIS — D124 Benign neoplasm of descending colon: Secondary | ICD-10-CM

## 2021-04-27 DIAGNOSIS — Z1211 Encounter for screening for malignant neoplasm of colon: Secondary | ICD-10-CM

## 2021-04-27 MED ORDER — SODIUM CHLORIDE 0.9 % IV SOLN: 500.0000 mL | Freq: Once | INTRAVENOUS | Status: DC

## 2021-04-27 NOTE — Progress Notes (Signed)
Sedate, gd SR, tolerated procedure well, VSS, report to RN 

## 2021-04-27 NOTE — Progress Notes (Signed)
VS completed by DT.  Pt's states no medical or surgical changes since previsit or office visit.  

## 2021-04-27 NOTE — Op Note (Signed)
[facility] ?Patient Name: Drew Ochoa ?Procedure Date: 04/27/2021 8:50 AM ?MRN: [id number] ?Endoscopist: Jerene Bears , MD ?Age: 56 ?Referring MD:  ?Date of Birth: May 01, 1965 ?Gender: Male ?Account #: [id number] ?Procedure:                Colonoscopy ?Indications:              Screening for colorectal malignant neoplasm,  ?                          colonoscopy 2012 for IDA (distal hyperplastic  ?                          polyps only) ?Medicines:                Monitored Anesthesia Care ?Procedure:                Pre-Anesthesia Assessment: ?                          - Prior to the procedure, a History and Physical  ?                          was performed, and patient medications and  ?                          allergies were reviewed. The patient's tolerance of  ?                          previous anesthesia was also reviewed. The risks  ?                          and benefits of the procedure and the sedation  ?                          options and risks were discussed with the patient.  ?                          All questions were answered, and informed consent  ?                          was obtained. Prior Anticoagulants: The patient has  ?                          taken no previous anticoagulant or antiplatelet  ?                          agents. ASA Grade Assessment: III - A patient with  ?                          severe systemic disease. After reviewing the risks  ?                          and benefits, the patient was deemed in  ?  satisfactory condition to undergo the procedure. ?                          After obtaining informed consent, the colonoscope  ?                          was passed under direct vision. Throughout the  ?                          procedure, the patient's blood pressure, pulse, and  ?                          oxygen saturations were monitored continuously. The  ?                          Olympus CF-HQ190L (Serial# [id number]) Colonoscope was  ?                           introduced through the anus and advanced to the  ?                          cecum, identified by appendiceal orifice and  ?                          ileocecal valve. The colonoscopy was performed  ?                          without difficulty. The patient tolerated the  ?                          procedure well. The quality of the bowel  ?                          preparation was good. The ileocecal valve,  ?                          appendiceal orifice, and rectum were photographed. ?Scope In: 8:57:17 AM ?Scope Out: 9:15:23 AM ?Scope Withdrawal Time: 0 hours 15 minutes 23 seconds  ?Total Procedure Duration: 0 hours 18 minutes 6 seconds  ?Findings:                 The digital rectal exam was normal. ?                          Two sessile polyps were found in the hepatic  ?                          flexure. The polyps were 4 to 5 mm in size. These  ?                          polyps were removed with a cold snare. Resection  ?                          and retrieval were complete. ?  Two sessile polyps were found in the descending  ?                          colon. The polyps were 4 to 5 mm in size. These  ?                          polyps were removed with a cold snare. Resection  ?                          and retrieval were complete. ?                          A 4 mm polyp was found in the distal sigmoid colon.  ?                          The polyp was sessile. The polyp was removed with a  ?                          cold snare. Resection and retrieval were complete. ?                          Evidence of previous hemorrhoidectomy with internal  ?                          hemorrhoids found during retroflexion. The  ?                          hemorrhoids were small. ?Complications:            No immediate complications. ?Estimated Blood Loss:     Estimated blood loss was minimal. ?Impression:               - Two 4 to 5 mm polyps at the hepatic flexure,  ?                           removed with a cold snare. Resected and retrieved. ?                          - Two 4 to 5 mm polyps in the descending colon,  ?                          removed with a cold snare. Resected and retrieved. ?                          - One 4 mm polyp in the distal sigmoid colon,  ?                          removed with a cold snare. Resected and retrieved. ?                          - Prior hemorrhoidectomy with small residual  ?  internal hemorrhoids. ?Recommendation:           - Patient has a contact number available for  ?                          emergencies. The signs and symptoms of potential  ?                          delayed complications were discussed with the  ?                          patient. Return to normal activities tomorrow.  ?                          Written discharge instructions were provided to the  ?                          patient. ?                          - Resume previous diet. ?                          - Continue present medications. ?                          - Await pathology results. ?                          - Repeat colonoscopy is recommended. The  ?                          colonoscopy date will be determined after pathology  ?                          results from today's exam become available for  ?                          review. ?Jerene Bears, MD ?04/27/2021 9:19:36 AM ?This report has been signed electronically. ?

## 2021-04-27 NOTE — Progress Notes (Signed)
? ?GASTROENTEROLOGY PROCEDURE H&P NOTE  ? ?Primary Care Physician: ?Elby Showers, MD ? ? ? ?Reason for Procedure:  Colon cancer screening ? ?Plan:    Colonoscopy ? ?Patient is appropriate for endoscopic procedure(s) in the ambulatory ([facility]) setting. ? ?The nature of the procedure, as well as the risks, benefits, and alternatives were carefully and thoroughly reviewed with the patient. Ample time for discussion and questions allowed. The patient understood, was satisfied, and agreed to proceed.  ? ? ? ?HPI: ?Drew Ochoa is a 56 y.o. male who presents for colonoscopy.  Medical history as below.  Tolerated the prep.  No recent chest pain or shortness of breath.  No abdominal pain today. ? ?Past Medical History:  ?Diagnosis Date  ? Arthritis   ? Blood in stool   ? Bronchitis   ? hx of   ? Colon polyps   ? Complication of anesthesia   ? hard to wake up, "slept too long"  ? Complication of anesthesia   ? problems urinating  ? Hemorrhoids   ? History of kidney stones   ? Hyperlipidemia   ? Iron deficiency anemia   ? history of  ? OSA on CPAP   ? Skin cancer   ? left shoulder; head  ? Sleep apnea   ? Weakness   ? ? ?Past Surgical History:  ?Procedure Laterality Date  ? BACK SURGERY  2008  ? cyst removal   ? COLONOSCOPY  10/24/2010  ? Dr.Larine Fielding  ? ESOPHAGOGASTRODUODENOSCOPY ENDOSCOPY    ? EYE SURGERY    ? cyst removal -eyelid 3rd grade  ? KNEE SURGERY Right 2006  ? meniscal repair  ? NOSE SURGERY    ? cyst removal   ? PARTIAL KNEE ARTHROPLASTY Right   ? SHOULDER ARTHROSCOPY WITH ROTATOR CUFF REPAIR AND SUBACROMIAL DECOMPRESSION Right 09/23/2012  ? Procedure: RIGHT SHOULDER ARTHROSCOPY WITH ROTATOR CUFF REPAIR AND SUBACROMIAL DECOMPRESSION AND DISTAL CLAVICLE RESECTION;  Surgeon: Cammie Sickle., MD;  Location: [facility];  Service: Orthopedics;  Laterality: Right;  ? TOTAL KNEE ARTHROPLASTY Right 03/03/2019  ? Procedure: CONVERSION OF PARTIAL KNEE TO TOTAL KNEE ARTHROPLASTY;  Surgeon: Marchia Bond, MD;  Location: [facility];  Service: Orthopedics;  Laterality: Right;  ? TRANSANAL HEMORRHOIDAL DEARTERIALIZATION  02/01/2012  ? Procedure: TRANSANAL HEMORRHOIDAL DEARTERIALIZATION;  Surgeon: Leighton Ruff, MD;  Location: [facility];  Service: General;  Laterality: N/A;  ? ? ?Prior to Admission medications   ?Not on File  ? ? ?No current outpatient medications on file.  ? ?Current Facility-Administered Medications  ?Medication Dose Route Frequency Provider Last Rate Last Admin  ? 0.9 %  sodium chloride infusion  500 mL Intravenous Once Vikas Wegmann, Lajuan Lines, MD      ? ? ?Allergies as of 04/27/2021  ? (No Known Allergies)  ? ? ?Family History  ?Problem Relation Age of Onset  ? Mental illness Mother   ? Skin cancer Mother   ? Arthritis Father   ? Depression Father   ? Colon polyps Father   ? Bladder Cancer Father   ? Diabetes Maternal Uncle   ? Alzheimer's disease Maternal Uncle   ? Colon cancer Neg Hx   ? Esophageal cancer Neg Hx   ? Stomach cancer Neg Hx   ? Rectal cancer Neg Hx   ? ? ?Social History  ? ?Socioeconomic History  ? Marital status: Married  ?  Spouse name: Not on file  ? Number of children: 0  ? Years of education:  Not on file  ? Highest education level: Not on file  ?Occupational History  ? Occupation: customs/import compliance  ?  Employer: [employer]  ?Tobacco Use  ? Smoking status: Never  ? Smokeless tobacco: Never  ? Tobacco comments:  ?  occ cigars rarely  ?Vaping Use  ? Vaping Use: Never used  ?Substance and Sexual Activity  ? Alcohol use: Yes  ?  Alcohol/week: 5.0 standard drinks  ?  Types: 5 Standard drinks or equivalent per week  ?  Comment: nightly  ? Drug use: No  ? Sexual activity: Not on file  ?Other Topics Concern  ? Not on file  ?Social History Narrative  ? Not on file  ? ?Social Determinants of Health  ? ?Financial Resource Strain: Not on file  ?Food Insecurity: Not on file  ?Transportation Needs: Not on file  ?Physical Activity: Not on file  ?Stress: Not on file  ?Social Connections: Not on  file  ?Intimate Partner Violence: Not on file  ? ? ?Physical Exam: ?Vital signs in last 24 hours: ?'@BP'$  122/61   Pulse 72   Temp (!) 96.6 ?F (35.9 ?C) (Temporal)   Ht '5\' 8"'$  (1.727 m)   Wt 185 lb (83.9 kg)   SpO2 99%   BMI 28.13 kg/m?  ?GEN: NAD ?EYE: Sclerae anicteric ?ENT: MMM ?CV: Non-tachycardic ?Pulm: CTA b/l ?GI: Soft, NT/ND ?NEURO:  Alert & Oriented x 3 ? ? ?Zenovia Jarred, MD ?[facility] ? ?04/27/2021 8:45 AM ? ?

## 2021-04-27 NOTE — Patient Instructions (Signed)
HANDOUTS PROVIDED ON: POLYPS & HEMORRHOIDS ? ?The polyps removed today have been sent for pathology.  The results can take 1-3 weeks to receive.  When your next colonoscopy should occur will be based on the pathology results.   ? ?You may resume your previous diet and medication schedule. ? ?Thank you for allowing [place] to care for you today!!! ? ?YOU HAD AN ENDOSCOPIC PROCEDURE TODAY AT [facility]:   Refer to the procedure report that was given to you for any specific questions about what was found during the examination.  If the procedure report does not answer your questions, please call your gastroenterologist to clarify.  If you requested that your care partner not be given the details of your procedure findings, then the procedure report has been included in a sealed envelope for you to review at your convenience later. ? ?YOU SHOULD EXPECT: Some feelings of bloating in the abdomen. Passage of more gas than usual.  Walking can help get rid of the air that was put into your GI tract during the procedure and reduce the bloating. If you had a lower endoscopy (such as a colonoscopy or flexible sigmoidoscopy) you may notice spotting of blood in your stool or on the toilet paper. If you underwent a bowel prep for your procedure, you may not have a normal bowel movement for a few days. ? ?Please Note:  You might notice some irritation and congestion in your nose or some drainage.  This is from the oxygen used during your procedure.  There is no need for concern and it should clear up in a day or so. ? ?SYMPTOMS TO REPORT IMMEDIATELY: ? ?Following lower endoscopy (colonoscopy or flexible sigmoidoscopy): ? Excessive amounts of blood in the stool ? Significant tenderness or worsening of abdominal pains ? Swelling of the abdomen that is new, acute ? Fever of 100?F or higher ? ?For urgent or emergent issues, a gastroenterologist can be reached at any hour by calling [phone]. ?Do not use MyChart  messaging for urgent concerns.  ? ? ?DIET:  We do recommend a small meal at first, but then you may proceed to your regular diet.  Drink plenty of fluids but you should avoid alcoholic beverages for 24 hours. ? ?ACTIVITY:  You should plan to take it easy for the rest of today and you should NOT DRIVE or use heavy machinery until tomorrow (because of the sedation medicines used during the test).   ? ?FOLLOW UP: ?Our staff will call the number listed on your records Monday morning between 7:15 am and 8:15 am following your procedure to check on you and address any questions or concerns that you may have regarding the information given to you following your procedure. If we do not reach you, we will leave a message.  We will attempt to reach you two times.  During this call, we will ask if you have developed any symptoms of COVID 19. If you develop any symptoms (ie: fever, flu-like symptoms, shortness of breath, cough etc.) before then, please call [phone].  If you test positive for Covid 19 in the 2 weeks post procedure, please call and report this information to [place].   ? ?If any biopsies were taken you will be contacted by phone or by letter within the next 1-3 weeks.  Please call us at [phone] if you have not heard about the biopsies in 3 weeks.  ? ? ?SIGNATURES/CONFIDENTIALITY: ?You and/or your care partner have signed  paperwork which will be entered into your electronic medical record.  These signatures attest to the fact that that the information above on your After Visit Summary has been reviewed and is understood.  Full responsibility of the confidentiality of this discharge information lies with you and/or your care-partner. ? ?

## 2021-04-27 NOTE — Progress Notes (Signed)
Called to room to assist during endoscopic procedure.  Patient ID and intended procedure confirmed with present staff. Received instructions for my participation in the procedure from the performing physician.  

## 2021-05-01 ENCOUNTER — Telehealth: Payer: Self-pay | Admitting: *Deleted

## 2021-05-01 ENCOUNTER — Encounter: Payer: Self-pay | Admitting: Internal Medicine

## 2021-05-01 NOTE — Telephone Encounter (Signed)
Attempted f/u phone call. No answer. Left message. °

## 2021-05-01 NOTE — Telephone Encounter (Signed)
?  Follow up Call- ? ? ?  04/27/2021  ?  7:53 AM  ?Call back number  ?Post procedure Call Back phone  # [phone]  ?Permission to leave phone message Yes  ?  ?No answer at 2nd attempt follow up phone call.  Left message on voicemail.   ?

## 2023-02-19 ENCOUNTER — Other Ambulatory Visit: Payer: Managed Care, Other (non HMO)

## 2023-02-19 DIAGNOSIS — Z0000 Encounter for general adult medical examination without abnormal findings: Secondary | ICD-10-CM

## 2023-02-19 DIAGNOSIS — E7800 Pure hypercholesterolemia, unspecified: Secondary | ICD-10-CM

## 2023-02-19 DIAGNOSIS — Z125 Encounter for screening for malignant neoplasm of prostate: Secondary | ICD-10-CM

## 2023-02-20 LAB — LIPID PANEL
Cholesterol: 245 mg/dL — ABNORMAL HIGH (ref <200)
HDL: 85 mg/dL (ref >=40)
LDL Cholesterol (Calc): 142 mg/dL — ABNORMAL HIGH
Non-HDL Cholesterol (Calc): 160 mg/dL — ABNORMAL HIGH (ref <130)
Total CHOL/HDL Ratio: 2.9 (calc) (ref <5.0)
Triglycerides: 77 mg/dL (ref <150)

## 2023-02-20 LAB — CBC WITH DIFFERENTIAL/PLATELET
Absolute Lymphocytes: 1590 {cells}/uL (ref 850–3900)
Absolute Monocytes: 510 {cells}/uL (ref 200–950)
Basophils Absolute: 50 {cells}/uL (ref 0–200)
Basophils Relative: 0.9 %
Eosinophils Absolute: 118 {cells}/uL (ref 15–500)
Eosinophils Relative: 2.1 %
HCT: 46.4 % (ref 38.5–50.0)
Hemoglobin: 15.4 g/dL (ref 13.2–17.1)
MCH: 28.7 pg (ref 27.0–33.0)
MCHC: 33.2 g/dL (ref 32.0–36.0)
MCV: 86.6 fL (ref 80.0–100.0)
MPV: 9.8 fL (ref 7.5–12.5)
Monocytes Relative: 9.1 %
Neutro Abs: 3332 {cells}/uL (ref 1500–7800)
Neutrophils Relative %: 59.5 %
Platelets: 283 10*3/uL (ref 140–400)
RBC: 5.36 10*6/uL (ref 4.20–5.80)
RDW: 12.9 % (ref 11.0–15.0)
Total Lymphocyte: 28.4 %
WBC: 5.6 10*3/uL (ref 3.8–10.8)

## 2023-02-20 LAB — COMPLETE METABOLIC PANEL WITH GFR
AG Ratio: 1.6 (calc) (ref 1.0–2.5)
ALT: 16 U/L (ref 9–46)
AST: 18 U/L (ref 10–35)
Albumin: 4.2 g/dL (ref 3.6–5.1)
Alkaline phosphatase (APISO): 48 U/L (ref 35–144)
BUN: 14 mg/dL (ref 7–25)
CO2: 28 mmol/L (ref 20–32)
Calcium: 9.1 mg/dL (ref 8.6–10.3)
Chloride: 104 mmol/L (ref 98–110)
Creat: 1.21 mg/dL (ref 0.70–1.30)
Globulin: 2.6 g/dL (ref 1.9–3.7)
Glucose, Bld: 93 mg/dL (ref 65–99)
Potassium: 4.7 mmol/L (ref 3.5–5.3)
Sodium: 139 mmol/L (ref 135–146)
Total Bilirubin: 0.9 mg/dL (ref 0.2–1.2)
Total Protein: 6.8 g/dL (ref 6.1–8.1)
eGFR: 70 mL/min/{1.73_m2} (ref >=60)

## 2023-02-20 LAB — PSA: PSA: 1.24 ng/mL (ref <=4.00)

## 2023-02-21 ENCOUNTER — Encounter: Payer: Self-pay | Admitting: Internal Medicine

## 2023-02-21 ENCOUNTER — Ambulatory Visit: Payer: Managed Care, Other (non HMO) | Admitting: Internal Medicine

## 2023-02-21 VITALS — BP 110/80 | HR 74 | Ht 67.75 in | Wt 197.0 lb

## 2023-02-21 DIAGNOSIS — Z0000 Encounter for general adult medical examination without abnormal findings: Secondary | ICD-10-CM

## 2023-02-21 DIAGNOSIS — Z860101 Personal history of adenomatous and serrated colon polyps: Secondary | ICD-10-CM

## 2023-02-21 DIAGNOSIS — E7800 Pure hypercholesterolemia, unspecified: Secondary | ICD-10-CM | POA: Diagnosis not present

## 2023-02-21 DIAGNOSIS — Z23 Encounter for immunization: Secondary | ICD-10-CM | POA: Diagnosis not present

## 2023-02-21 LAB — POCT URINALYSIS DIP (CLINITEK)
Bilirubin, UA: NEGATIVE
Blood, UA: NEGATIVE
Glucose, UA: NEGATIVE mg/dL
Ketones, POC UA: NEGATIVE mg/dL
Leukocytes, UA: NEGATIVE
Nitrite, UA: NEGATIVE
POC PROTEIN,UA: NEGATIVE
Spec Grav, UA: 1.01 (ref 1.010–1.025)
Urobilinogen, UA: 0.2 U/dL
pH, UA: 6.5 (ref 5.0–8.0)

## 2023-02-21 NOTE — Patient Instructions (Signed)
It was a pleasure to see you today. Please have coronary calcium score done at [facility]. Tetanus update given. Return in one year of as needed.

## 2023-02-21 NOTE — Progress Notes (Signed)
 Annual Wellness Visit   Patient Care Team: Margaree Mackintosh, MD as PCP - General (Internal Medicine)  Visit Date: 02/21/23   Chief Complaint  Patient presents with   Annual Exam   Subjective:  Patient: Drew Ochoa, Male DOB: 05-13-1965, 58 y.o. MRN: [id number] Drayce Tawil is a 58 y.o. Male who presents today for his Annual Wellness Visit. Patient has history of tricompartmental arthritis left knee, s/p left knee arthroplsaty, s/p partial & total right knee replacement, iron deficiency anemia, r. Rotator cuff repair  Reports that in May 2024 he fell on a rock chest-first. States that he didn't get this evaluated, however notes that he experienced pain for a couple days afterwards with deep inspiration.   Notes his nose runs when he eats and during weather changes. Likely vasomotor rhinitis.   History of Pure Hypercholesterolemia previously treated with Crestor 5 mg 3 times a week with supper. 02/19/2023 Lipid Panel, compared to 11/10/2019: Cholesterol 245, elevated from 220; LDL 142, elevated from 121; Non-HDL 160, elevated from 138. Notes that Superbowl weekend and birthday (1[birth year]) feasting may have contributed to these results. Recommended Coronary Calcium Scoring.   Labs 02/19/2023 CBC: WNL CMP: WNL   Colonoscopy 04/27/2021 performed by Vonna Kotyk Pyrtlewith 5 total polyps removed (2 Benign and 3 Pre-cancerous) - two sessile 4-5 mm from hepatic flexure, two sessile 4-5 mm in descending colon, one 4 mm in distal sigmoid colon; Evidence of prior hemorrhoidectomy w/ small internal hemorrhoids found. Repeat recommended 2026. Remote history of Iron Deficiency Anemia thought to be due to significant rectal bleeding from hemorrhoids.   PSA 1.24 02/19/2023    Vaccine Counseling: Due for Covid-19, Shingles 2/2, and Tdap; UTD on Flu Reports that he received his Covid-19 and Flu vaccine together in 10/2022 - will obtain records. Updating Tdap today.  Past Medical History:  Diagnosis Date    Arthritis    Blood in stool    Bronchitis    hx of    Colon polyps    Complication of anesthesia    hard to wake up, "slept too long"   Complication of anesthesia    problems urinating   Hemorrhoids    History of kidney stones    Hyperlipidemia    Iron deficiency anemia    history of   OSA on CPAP    Skin cancer    left shoulder; head   Sleep apnea    Weakness   Medical/Surgical History Narrative:  Family History  Problem Relation Age of Onset   Mental illness Mother    Skin cancer Mother    Arthritis Father    Depression Father    Colon polyps Father    Bladder Cancer Father    Diabetes Maternal Uncle    Alzheimer's disease Maternal Uncle    Colon cancer Neg Hx    Esophageal cancer Neg Hx    Stomach cancer Neg Hx    Rectal cancer Neg Hx   Family History Narrative: Maternal Grandmother with hx of massive stroke at age 14 and died at age 24.   Maternal Grandfather had CABG at age 2 and died at age 22.   Paternal Grandfather with history of alcoholism recovered at age 65.  He subsequently had CABG at age 39 and died at age 68.   Paternal Grandmother in good health.   Mother with hx of Depression and Skin Cancer.   Father with hx of Rheumatoid Arthritis diagnosed at age 77 and also w/ hx of Colon Polyps.  Reports cousin recently had a heart attack  Social History   Social History Narrative   He is married.  No children.  Social alcohol consumption.  Occasionally smokes cigars.  He works for [employer].   Review of Systems  Constitutional:  Negative for chills, fever, malaise/fatigue and weight loss.  HENT:  Negative for hearing loss, sinus pain and sore throat.   Respiratory:  Negative for cough, hemoptysis and shortness of breath.   Cardiovascular:  Negative for chest pain, palpitations, leg swelling and PND.  Gastrointestinal:  Negative for abdominal pain, constipation, diarrhea, heartburn, nausea and vomiting.  Genitourinary:  Negative for dysuria, frequency  and urgency.  Musculoskeletal:  Negative for back pain, myalgias and neck pain.  Skin:  Negative for itching and rash.  Neurological:  Negative for dizziness, tingling, seizures and headaches.  Endo/Heme/Allergies:  Negative for polydipsia.  Psychiatric/Behavioral:  Negative for depression. The patient is not nervous/anxious.     Objective:  Vitals: BP 110/80   Pulse 74   Ht 5' 7.75" (1.721 m)   Wt 197 lb (89.4 kg)   SpO2 97%   BMI 30.18 kg/m  Physical Exam Vitals and nursing note reviewed.  Constitutional:      General: He is awake. He is not in acute distress.    Appearance: Normal appearance. He is not ill-appearing or toxic-appearing.  HENT:     Head: Normocephalic and atraumatic.     Right Ear: Tympanic membrane, ear canal and external ear normal.     Left Ear: Tympanic membrane, ear canal and external ear normal.     Mouth/Throat:     Pharynx: Oropharynx is clear.  Eyes:     Extraocular Movements: Extraocular movements intact.     Pupils: Pupils are equal, round, and reactive to light.  Neck:     Thyroid: No thyroid mass, thyromegaly or thyroid tenderness.     Vascular: No carotid bruit.  Cardiovascular:     Rate and Rhythm: Normal rate and regular rhythm. No extrasystoles are present.    Pulses:          Dorsalis pedis pulses are 1+ on the right side and 1+ on the left side.     Heart sounds: Normal heart sounds. No murmur heard.    No friction rub. No gallop.  Pulmonary:     Effort: Pulmonary effort is normal.     Breath sounds: Normal breath sounds. No decreased breath sounds, wheezing, rhonchi or rales.  Chest:     Chest wall: No mass.  Abdominal:     Palpations: Abdomen is soft. There is no hepatomegaly, splenomegaly or mass.     Tenderness: There is no abdominal tenderness.     Hernia: No hernia is present.  Genitourinary:    Prostate: Normal.  Musculoskeletal:     Cervical back: Normal range of motion.     Right lower leg: No edema.     Left lower leg:  No edema.  Lymphadenopathy:     Cervical: No cervical adenopathy.     Upper Body:     Right upper body: No supraclavicular adenopathy.     Left upper body: No supraclavicular adenopathy.  Skin:    General: Skin is warm and dry.  Neurological:     General: No focal deficit present.     Mental Status: He is alert and oriented to person, place, and time. Mental status is at baseline.     Cranial Nerves: Cranial nerves 2-12 are intact.  Sensory: Sensation is intact.     Motor: Motor function is intact.     Coordination: Coordination is intact.     Gait: Gait is intact.     Deep Tendon Reflexes: Reflexes are normal and symmetric.  Psychiatric:        Attention and Perception: Attention normal.        Mood and Affect: Mood normal.        Speech: Speech normal.        Behavior: Behavior normal. Behavior is cooperative.        Thought Content: Thought content normal.        Cognition and Memory: Cognition and memory normal.        Judgment: Judgment normal.   Most Recent Fall Risk Assessment:    11/12/2019    3:09 PM  Fall Risk   Falls in the past year? 0  Number falls in past yr: 0  Injury with Fall? 0  Follow up Falls evaluation completed   Most Recent Depression Screenings:    02/21/2023   10:58 AM 11/12/2019    3:09 PM  PHQ 2/9 Scores  PHQ - 2 Score 0 0   Results:  Studies Obtained And Personally Reviewed By Me:  Colonoscopy 04/27/2021 performed by Erick Blinks with 5 total polyps removed (2 Benign and 3 Pre-cancerous) - two sessile 4-5 mm from hepatic flexure, two sessile 4-5 mm in descending colon, one 4 mm in distal sigmoid colon with repeat recommendation of 2026; Evidence of prior hemorrhoidectomy w/ small internal hemorrhoids found.   Labs:     Component Value Date/Time   NA 139 02/19/2023 1159   K 4.7 02/19/2023 1159   CL 104 02/19/2023 1159   CO2 28 02/19/2023 1159   GLUCOSE 93 02/19/2023 1159   BUN 14 02/19/2023 1159   CREATININE 1.21 02/19/2023 1159    CALCIUM 9.1 02/19/2023 1159   PROT 6.8 02/19/2023 1159   ALBUMIN 4.0 10/03/2015 1156   AST 18 02/19/2023 1159   ALT 16 02/19/2023 1159   ALKPHOS 46 10/03/2015 1156   BILITOT 0.9 02/19/2023 1159   GFRNONAA 74 11/10/2019 0906   GFRAA 85 11/10/2019 0906    Lab Results  Component Value Date   WBC 5.6 02/19/2023   HGB 15.4 02/19/2023   HCT 46.4 02/19/2023   MCV 86.6 02/19/2023   PLT 283 02/19/2023   Lab Results  Component Value Date   CHOL 245 (H) 02/19/2023   HDL 85 02/19/2023   LDLCALC 142 (H) 02/19/2023   TRIG 77 02/19/2023   CHOLHDL 2.9 02/19/2023   Lab Results  Component Value Date   PSA 1.24 02/19/2023   PSA 1.02 11/10/2019   PSA 1.1 11/10/2018     Assessment & Plan:   Orders Placed This Encounter  Procedures   CT CARDIAC SCORING (SELF PAY ONLY)   POCT URINALYSIS DIP (CLINITEK)  Other Labs Reviewed today: CBC: WNL CMP: WNL   Pure Hypercholesterolemia not currently treated with medication. 02/19/2023 Lipid Panel, compared to 11/10/2019: Cholesterol 245, elevated from 220; LDL 142, elevated from 121; Non-HDL 160, elevated from 138. Recommended Coronary Calcium Scoring.   Colonoscopy 04/27/2021 performed by Erick Blinks with 5 total polyps removed (2 Benign and 3 Pre-cancerous) - two sessile 4-5 mm from hepatic flexure, two sessile 4-5 mm in descending colon, one 4 mm in distal sigmoid colon with repeat recommendation of 2026; Evidence of prior hemorrhoidectomy w/ small internal hemorrhoids found.   PSA 1.24 02/19/2023   Vaccine Counseling:  Due for Covid-19, Shingles 2/2, and Tdap; UTD on Flu Pt reports he received his Covid-19 and Flu vaccine together in 10/2022 - will obtain records. Updating Tdap today.    Annual wellness visit done today including the all of the following: Reviewed patient's Family Medical History Reviewed and updated list of patient's medical providers Assessment of cognitive impairment was done Assessed patient's functional ability Established a  written schedule for health screening services Health Risk Assessent Completed and Reviewed  Discussed health benefits of physical activity, and encouraged him to engage in regular exercise appropriate for his age and condition.    I,Emily Lagle,acting as a Neurosurgeon for Margaree Mackintosh, MD.,have documented all relevant documentation on the behalf of Margaree Mackintosh, MD,as directed by  Margaree Mackintosh, MD while in the presence of Margaree Mackintosh, MD.   I, Margaree Mackintosh, MD, have reviewed all documentation for this visit. The documentation on 03/08/23 for the exam, diagnosis, procedures, and orders are all accurate and complete.

## 2023-03-06 NOTE — Telephone Encounter (Signed)
 Copied from CRM [id number]. Topic: General - Other >> Mar 05, 2023  2:16 PM Dondra Prader E wrote: Reason for CRM: Roxana Hires from [facility] called regarding the   [phone] CT of the heart without contrast   They have faxed over this denial. Missing needed clinical information.   The case will remain open until end of business day.   [facility] [phone]  Reference case number: [id number]

## 2023-03-06 NOTE — Telephone Encounter (Signed)
 Most insurances does not pay for this test. We tell the patients that up front. It is $100.00 to $150.00

## 2023-03-07 ENCOUNTER — Ambulatory Visit (HOSPITAL_COMMUNITY)
Admission: RE | Admit: 2023-03-07 | Discharge: 2023-03-07 | Disposition: A | Discharge status: Home / Self Care | Payer: Self-pay | Source: Ambulatory Visit | Attending: Internal Medicine | Admitting: Internal Medicine

## 2023-03-07 DIAGNOSIS — E7800 Pure hypercholesterolemia, unspecified: Secondary | ICD-10-CM | POA: Insufficient documentation

## 2023-03-08 ENCOUNTER — Encounter: Payer: Self-pay | Admitting: Internal Medicine

## 2024-02-24 ENCOUNTER — Other Ambulatory Visit: Payer: Managed Care, Other (non HMO)

## 2024-02-25 ENCOUNTER — Encounter: Payer: Managed Care, Other (non HMO) | Admitting: Internal Medicine
